# Patient Record
Sex: Female | Born: 1940 | Race: White | Hispanic: No | Marital: Married | Smoking: Former smoker
Health system: Southern US, Community
[De-identification: ages and names within clinical notes are randomized; demographics above are authoritative.]

## PROBLEM LIST (undated history)

## (undated) DIAGNOSIS — J449 Chronic obstructive pulmonary disease, unspecified: Secondary | ICD-10-CM

## (undated) DIAGNOSIS — Z9889 Other specified postprocedural states: Secondary | ICD-10-CM

## (undated) DIAGNOSIS — I214 Non-ST elevation (NSTEMI) myocardial infarction: Secondary | ICD-10-CM

## (undated) DIAGNOSIS — M797 Fibromyalgia: Secondary | ICD-10-CM

## (undated) DIAGNOSIS — I10 Essential (primary) hypertension: Secondary | ICD-10-CM

## (undated) DIAGNOSIS — E785 Hyperlipidemia, unspecified: Secondary | ICD-10-CM

## (undated) DIAGNOSIS — N289 Disorder of kidney and ureter, unspecified: Secondary | ICD-10-CM

## (undated) HISTORY — DX: Essential (primary) hypertension: I10

## (undated) HISTORY — PX: TOTAL KNEE ARTHROPLASTY: SHX125

## (undated) HISTORY — PX: CARPAL TUNNEL RELEASE: SHX101

## (undated) HISTORY — DX: Fibromyalgia: M79.7

## (undated) HISTORY — DX: Hyperlipidemia, unspecified: E78.5

## (undated) HISTORY — DX: Non-ST elevation (NSTEMI) myocardial infarction: I21.4

## (undated) HISTORY — DX: Disorder of kidney and ureter, unspecified: N28.9

## (undated) HISTORY — DX: Chronic obstructive pulmonary disease, unspecified: J44.9

## (undated) HISTORY — PX: CHOLECYSTECTOMY: SHX55

## (undated) HISTORY — DX: Other specified postprocedural states: Z98.890

---

## 1997-06-21 ENCOUNTER — Inpatient Hospital Stay (HOSPITAL_COMMUNITY): Admission: EM | Admit: 1997-06-21 | Discharge: 1997-06-23 | Payer: Self-pay | Admitting: Cardiology

## 1997-06-22 ENCOUNTER — Encounter (INDEPENDENT_AMBULATORY_CARE_PROVIDER_SITE_OTHER): Payer: Self-pay | Admitting: *Deleted

## 2005-07-12 ENCOUNTER — Encounter: Payer: Self-pay | Admitting: Cardiology

## 2005-07-18 ENCOUNTER — Ambulatory Visit: Payer: Self-pay | Admitting: Cardiology

## 2006-10-03 ENCOUNTER — Ambulatory Visit: Payer: Self-pay | Admitting: Cardiology

## 2007-07-16 ENCOUNTER — Inpatient Hospital Stay (HOSPITAL_COMMUNITY): Admission: RE | Admit: 2007-07-16 | Discharge: 2007-07-20 | Payer: Self-pay | Admitting: Orthopedic Surgery

## 2008-02-26 DIAGNOSIS — I214 Non-ST elevation (NSTEMI) myocardial infarction: Secondary | ICD-10-CM

## 2008-02-26 HISTORY — DX: Non-ST elevation (NSTEMI) myocardial infarction: I21.4

## 2008-12-22 ENCOUNTER — Ambulatory Visit: Payer: Self-pay | Admitting: Cardiology

## 2008-12-23 ENCOUNTER — Encounter: Payer: Self-pay | Admitting: Cardiology

## 2008-12-25 ENCOUNTER — Encounter: Payer: Self-pay | Admitting: Cardiology

## 2008-12-27 ENCOUNTER — Encounter: Payer: Self-pay | Admitting: Cardiology

## 2008-12-28 ENCOUNTER — Encounter: Payer: Self-pay | Admitting: Cardiology

## 2009-02-02 ENCOUNTER — Ambulatory Visit: Payer: Self-pay | Admitting: Cardiology

## 2009-02-02 DIAGNOSIS — I10 Essential (primary) hypertension: Secondary | ICD-10-CM | POA: Insufficient documentation

## 2009-02-02 DIAGNOSIS — E785 Hyperlipidemia, unspecified: Secondary | ICD-10-CM | POA: Insufficient documentation

## 2009-02-02 DIAGNOSIS — F17200 Nicotine dependence, unspecified, uncomplicated: Secondary | ICD-10-CM | POA: Insufficient documentation

## 2009-02-02 DIAGNOSIS — J449 Chronic obstructive pulmonary disease, unspecified: Secondary | ICD-10-CM | POA: Insufficient documentation

## 2009-02-02 DIAGNOSIS — I959 Hypotension, unspecified: Secondary | ICD-10-CM

## 2009-02-02 DIAGNOSIS — E669 Obesity, unspecified: Secondary | ICD-10-CM

## 2009-02-02 DIAGNOSIS — N259 Disorder resulting from impaired renal tubular function, unspecified: Secondary | ICD-10-CM | POA: Insufficient documentation

## 2009-02-02 DIAGNOSIS — F411 Generalized anxiety disorder: Secondary | ICD-10-CM | POA: Insufficient documentation

## 2009-02-02 DIAGNOSIS — J4489 Other specified chronic obstructive pulmonary disease: Secondary | ICD-10-CM | POA: Insufficient documentation

## 2009-02-02 DIAGNOSIS — R0602 Shortness of breath: Secondary | ICD-10-CM | POA: Insufficient documentation

## 2009-02-02 DIAGNOSIS — R071 Chest pain on breathing: Secondary | ICD-10-CM

## 2009-02-03 ENCOUNTER — Encounter: Payer: Self-pay | Admitting: Physician Assistant

## 2009-02-22 ENCOUNTER — Encounter: Payer: Self-pay | Admitting: Physician Assistant

## 2009-03-01 ENCOUNTER — Encounter (INDEPENDENT_AMBULATORY_CARE_PROVIDER_SITE_OTHER): Payer: Self-pay | Admitting: *Deleted

## 2009-03-10 ENCOUNTER — Ambulatory Visit: Payer: Self-pay | Admitting: Cardiology

## 2009-03-10 DIAGNOSIS — I214 Non-ST elevation (NSTEMI) myocardial infarction: Secondary | ICD-10-CM | POA: Insufficient documentation

## 2009-03-28 ENCOUNTER — Encounter: Payer: Self-pay | Admitting: Physician Assistant

## 2010-03-27 NOTE — Assessment & Plan Note (Signed)
Summary: 1 mo fu -srs   Visit Type:  Follow-up Primary Provider:  Dr. Kirstie Peri   History of Present Illness: 70 year old woman presents for a followup visit, last seen in the office back in December. she continues to do well, with no progressive shortness of breath, and no significant chest pain. She states that she has stopped smoking completely at this point. I congratulated her. She has tolerated the medications outlined below.  Labs from 10 December revealed a BUN and creatinine of 31 and 1.5 respectively, sodium 136, potassium 5.0. She was continued on ACE inhibitor therapy with followup labs on 29 December revealing a stable BUN and creatinine of 25 and 1.5 respectively, sodium 143, and potassium 4.9.  She is due to see Dr. Sherryll Burger for a physical in the near future, with lab work at that time.  Preventive Screening-Counseling & Management  Alcohol-Tobacco     Smoking Status: quit     Year Started: 57 years - started age 32     Year Quit: 12-24-2008  Current Medications (verified): 1)  Vitamin D 1000 Unit Tabs (Cholecalciferol) .... Take 1 Tablet By Mouth Once A Day 2)  Lasix 40 Mg Tabs (Furosemide) .... Take 1 Tablet By Mouth Once A Day 3)  Zestril 20 Mg Tabs (Lisinopril) .... Take 1 Tablet By Mouth Once A Day 4)  Aspirin 81 Mg Tbec (Aspirin) .... Take One Tablet By Mouth Daily 5)  Lyrica 150 Mg Caps (Pregabalin) .... Take 1 Capsule By Mouth Two Times A Day 6)  Potassium Chloride Crys Cr 20 Meq Cr-Tabs (Potassium Chloride Crys Cr) .... Take One Tablet By Mouth Daily 7)  Meloxicam 7.5 Mg Tabs (Meloxicam) .... Take 1 Tablet By Mouth Two Times A Day 8)  Omeprazole 20 Mg Cpdr (Omeprazole) .... Take 1 Tablet By Mouth Once A Day 9)  Valium 10 Mg Tabs (Diazepam) .... As Needed 10)  Lortab 10 10-500 Mg Tabs (Hydrocodone-Acetaminophen) .... As Needed 11)  Citalopram Hydrobromide 20 Mg Tabs (Citalopram Hydrobromide) .... Take 1 Tablet By Mouth Once A Day 12)  Amitriptyline Hcl 10 Mg Tabs  (Amitriptyline Hcl) .... Take 1 Tab By Mouth At Bedtime  Allergies (verified): No Known Drug Allergies  Past History:  Past Medical History: Last updated: 02/02/2009 Non-ST elevation myocardial infarction, probable type 2, 11/10   a) normal coronary arteries in 1999 [facility]) Hypertension COPD Morbid obesity Fibromyalgia with chronic pain Hyperlipidemia Renal insufficiency  Social History: Last updated: 02/02/2009 Retired  (worked at [employer]) Married  Tobacco Use - Former.  Alcohol Use - no  Social History: Smoking Status:  quit  Review of Systems  The patient denies anorexia, fever, chest pain, syncope, peripheral edema, prolonged cough, headaches, hemoptysis, melena, and hematochezia.         Otherwise reviewed and negative.  Vital Signs:  Patient profile:   70 year old female Height:      65 inches Weight:      269.75 pounds Pulse rate:   60 / minute BP sitting:   112 / 62  (left arm) Cuff size:   large  Vitals Entered By: Hoover Brunette, LPN (March 10, 2009 1:45 PM) Is Patient Diabetic? No Comments 1 month f/u   Physical Exam  Additional Exam:  Obese woman in no acute distress. HEENT: Conjunctiva and lids normal, oropharynx clear. Neck: Supple, increased girth, no bruits or thyromegaly. Lungs: Clear with diminished breath sounds nonlabored. Cardiac: Indistinct PMI, regular rate and rhythm, no S3. Abdomen: Obese, nontender, bowel sounds  present. Skin: Warm and dry. Musculoskeletal: No kyphosis. Neuropsychiatric: Alert and oriented x3, affect appropriate.   Impression & Recommendations:  Problem # 1:  ACUT MI SUBENDOCARDIAL INFARCT SUBSQT EPIS CARE (ICD-410.72)  History of probable type II non-ST elevation myocardial infarction as outlined previously. She is not experiencing any significant chest pain or breathlessness at this point on fairly minimal medical therapy. Resting heart rate is well controlled, so we will hold off on adding beta blocker  therapy. Blood pressure is also very well controlled. She will follow up with Dr. Sherryll Burger for a physical in the near future, and we will see her back in 6 months.  Her updated medication list for this problem includes:    Zestril 20 Mg Tabs (Lisinopril) .Marland Kitchen... Take 1 tablet by mouth once a day    Aspirin 81 Mg Tbec (Aspirin) .Marland Kitchen... Take one tablet by mouth daily  Problem # 2:  RENAL INSUFFICIENCY (ICD-588.9)  Stable based on serial labs as detailed above, on Zestril.  Problem # 3:  HYPERTENSION (ICD-401.9)  Blood pressure well controlled today.  Her updated medication list for this problem includes:    Lasix 40 Mg Tabs (Furosemide) .Marland Kitchen... Take 1 tablet by mouth once a day    Zestril 20 Mg Tabs (Lisinopril) .Marland Kitchen... Take 1 tablet by mouth once a day    Aspirin 81 Mg Tbec (Aspirin) .Marland Kitchen... Take one tablet by mouth daily  Problem # 4:  HYPERLIPIDEMIA (ICD-272.4)  History of hyperlipidemia. Patient should have a followup lipid profile with liver function tests at her physical with Dr. Sherryll Burger. Additional medical therapy may be required.  The following medications were removed from the medication list:    Simvastatin 40 Mg Tabs (Simvastatin) .Marland Kitchen... Take one tablet by mouth daily at bedtime  Patient Instructions: 1)  Your physician wants you to follow-up in: 6 months. You will receive a reminder letter in the mail one-two months in advance. If you don't receive a letter, please call our office to schedule the follow-up appointment. 2)  Your physician recommends that you continue on your current medications as directed. Please refer to the Current Medication list given to you today.

## 2010-03-27 NOTE — Letter (Signed)
Summary: Generic Engineer, agricultural at [facility] [address]   [place], [place] [address]   Phone: [phone]  Fax: [phone]        March 01, 2009 MRN: [id number]    Sara Riggs [address] [place], [place]  [address]    Dear [name],  Enclosed you will find the order for your lab work as we discussed on the phone today. Please, take this order with you to the [facility] around February 1st to have this lab work done. You do not need to be fasting.        Sincerely,  Cyril Loosen, RN, BSN  This letter has been electronically signed by your physician.

## 2010-07-10 NOTE — Consult Note (Signed)
Sara Riggs, Sara Riggs NO.:  [id number]   MEDICAL RECORD NO.:  [id number]          PATIENT TYPE:  INP   LOCATION:  1611                         FACILITY:  [facility]   PHYSICIAN:  Michaelyn Barter, M.D. DATE OF BIRTH:  March 02, 1940   DATE OF CONSULTATION:  DATE OF DISCHARGE:                                 CONSULTATION   HISTORY OF PRESENT ILLNESS:  Ms. Sara Riggs is a 70 year old female with a  past medical history of hypertension and COPD, who was actually admitted  by the orthopedic service.  On Jul 16, 2007, she underwent a left LCS  total knee replacement secondary to having osteoarthritis with severe  osteoporosis.  Currently, she is wide awake and indicates that she feels  okay.  She denies having any shortness of breath or chest pain.  She  currently has no complaints.  She denies any nausea, vomiting, fevers or  chills.   PAST MEDICAL HISTORY:  1. Hypertension.  2. Questionable history of diabetes mellitus.  The patient states that      she was told by her primary care doctor that she had diabetes      mellitus; however, she has not taken any medications.  She      questions whether or not she actually has diabetes mellitus.  3. Fibromyalgia.  4. COPD.  Patient indicates that she does have home oxygen, but she      uses it only occasionally.  5. Bronchitis.  6. Depression.   PAST SURGICAL HISTORY:  1. Total hysterectomy secondary to fibroids.  2. Cholecystectomy.  3. C-section.   ALLERGIES:  No known drug allergies.   HOME MEDICATIONS:  1. Ventolin inhaler.  2. Methylprednisolone, started Jul 09, 2007.  3. Azithromycin.  4. Lisinopril.  5. Furosemide.  6. Omeprazole.  7. Potassium chloride.  8. Citalopram.  9. Metolazone.  10.Diazepam.  11.Lyrica.  12.Hydrocodone APAP.   ALLERGIES:  No known drug allergies.   SOCIAL HISTORY:  Cigarettes:  Patient started smoking at the age of 70.  She smokes a half pack of cigarettes per day.  Alcohol:  Patient  denies.   FAMILY HISTORY:  Mother had asthma.  She died from an MI at the age of  16.  Father had peptic ulcer disease.   REVIEW OF SYSTEMS:  As per HPI.   PHYSICAL EXAMINATION:  Patient is awake.  She is cooperative.  She is in  no obvious distress.  She currently looks comfortable.  VITALS:  Temperature is 98.7.  Heart rate 69.  Respirations 20.  Blood  pressure 100/53.  O2 sat 88% on room air.  NECK:  Supple.  No JVD.  No lymphadenopathy.  CARDIAC:  S1 and S2 present.  Regular rate and rhythm.  RESPIRATORY:  Breath sounds are decreased bilaterally.  There are  scattered expiratory wheezes also auscultated.  ABDOMEN:  Soft, nontender, nondistended.  Bowel sounds are present.  No  masses palpated.  EXTREMITIES:  Right leg has no edema.  NEUROLOGIC:  Patient is alert and oriented x3.  MUSCULOSKELETAL:  Upper extremity strength 5/5.   LABS:  Sodium  140, potassium 5.2, chloride 103, CO2 31, glucose 153, BUN  48, creatinine 1.72, calcium 9.  White blood cell count 12.4, hemoglobin  13, hematocrit 39, platelet count 118.  Urinalysis is negative.   ASSESSMENT/PLAN:  1. Hyperkalemia:  The patient is currently receiving daily potassium      supplementation.  The patient's hyperkalemia may be secondary to      this.  Will discontinue the K-Dur.  Will consider giving the      patient Kayexalate.  Will repeat her potassium level in the      morning.  2. Renal insufficiency:  Whether or not this is acute-on-chronic is      also questionable.  Given the fact that the patient recently      underwent a surgical procedure, there may be an acute component      secondary to fluid loss, which would account for a prerenal      component; however, again, the patient's baseline BUN and      creatinine are unknown.  Likewise, the patient does have a history      of hypertension and a questionable history of diabetes mellitus;      therefore, both of those factors may have also contributed to a       chronic component.  The patient does have a history of      hypertension; however, it appears that she has been hypotensive to      normotensive over the past 24 hours; increasing the likelihood that      this may an acute process.  Will continue the current IV fluid      hydration.  Will order a renal ultrasound.  Will monitor her BUN      and creatinine closely.  Will consider holding any medications      which may precipitate an adverse effect on the patient's renal      system, i.e., her lisinopril.  There may not be a relationship      between her elevated BUN and creatinine and the lisinopril, given      the fact that she has more than likely has been on this for quite      some time.  3. History of hypertension:  The patient's blood pressure is      normotensive to hypotensive.  There may be an element of volume      depletion accounting for this.  The patient is on multiple      antihypertensive medications, including a diuretic, i.e., Lasix.      She currently is receiving IV fluid hydration; however, Lasix may      negate the benefit of her receiving the IV fluids.  May consider      holding the Lasix and re-evaluating the patient's blood pressure      and renal status within the next 24 hours.  4. Thrombocytopenia:  Will monitor this for now.  Will continue to      follow the patient.   Thanks for the consult.     Michaelyn Barter, M.D.  Electronically Signed    OR/MEDQ  D:  07/17/2007  T:  07/17/2007  Job:  [id number]

## 2010-07-10 NOTE — Op Note (Signed)
Sara Riggs, Sara Riggs                ACCOUNT NO.:  [id number]   MEDICAL RECORD NO.:  [id number]          PATIENT TYPE:  INP   LOCATION:  0001                         FACILITY:  [facility]   PHYSICIAN:  John L. Rendall, M.D.  DATE OF BIRTH:  Jul 17, 1940   DATE OF PROCEDURE:  07/16/2007  DATE OF DISCHARGE:                               OPERATIVE REPORT   PREOPERATIVE DIAGNOSIS:  End-stage osteoarthritis left knee.   SURGICAL PROCEDURES:  Left LCS total knee replacement with computer  navigation assistance.   POSTOPERATIVE DIAGNOSIS:  Osteoarthritis with severe osteoporosis.   SURGEON:  Rendall   ASSISTANT:  Duffy PAC   ANESTHESIA:  Epidural.   PATHOLOGY:  The patient has worn out knee with chronic pain unremitting  despite conservative measures of pills and injection.  She has bone-  against-bone on the medial femoral condyle, slight varus deformity and  slight hyperextension to the knee.  She has poor bone quality, and it is  possible to dent the cut surface of the proximal tibia with an index  finger with less than 10 pounds pressure.   PROCEDURE:  Under epidural anesthesia, the left leg is prepared with  DuraPrep and draped as a sterile field.  A high-on-the-thigh tourniquet  is applied sterilely, and the leg is wrapped out with the Esmarch, and  the tourniquet is used at 350 mm.  A midline incision is made.  The  patella is everted.  The femur is sized at a standard plus.  Debridement  is done in preparation for computer mapping.  The Schanz pins are then  placed in superomedial punctures on the tibia and in the distal medial  femur through the surgical incision.  The arrays are set up.  The  femoral head is identified.  The medial and lateral malleoli are  identified.  At this point, the proximal tibia and distal femur are  mapped to within 1 mm of reproducible accuracy.  Once this is completed,  the proximal tibial resection is carried out.  At this point, the very  soft bone is  clearly identified.  In view of her age and weight, it is  decided that we will go with a revision stem for an additional 1 inch of  purchase in the proximal tibia.  Once the proximal tibia cut is made,  the balancer is sought and is not found in our trays.  At this point, we  took a 25 minute time-out while a new balancer was obtained.  During  this time, we osteotomized the patella, preparing it for implantation  and did a synovectomy.  Once the balancer arrived, the alignment of the  leg was balanced within 1 degree of anatomical alignment from the hip to  the ankle, and the flexion gap was measured.  It was determined we would  probably need a 15 mm bearing and at this point, the anterior and  posterior flare of the distal femur were resected within 1 degree of  rotational anatomic accuracy.  The flexion gap was figured at about 15  mm at this point.  The distal femoral  cut was then made, and the  extension gap was at least 17-1/2 mm at this point.  The knee was then  debrided of the menisci and what is left of the cruciates and spurs  taken off the back of the femoral condyles.  The recessing guide was  then used.  Attention was then turned to the proximal tibia where  preparation for the MBT tray size 4 was made.  Trial of a size 4 MBT  tray, 15 and 17.5 bearing with the standard plus femur revealed the knee  went into too much hyperextension, although longitudinal alignment was  within about 2.5-3 degrees of anatomic alignment.  The 20 mm spacer was  inserted, and that still had 9 degrees of hyperextension.  A decision  was then made to add metal blocks under the tibia to build it up and  downsize the tibial bearing to a 17.5, essentially filling in a 22.5 mm  gap.  This was done, and alignment was within 2.5 degrees of anatomic  with full extension and no significant hyperextension.  It was good in  both flexion and extension, and permanent components were then obtained.  Bony  surfaces were prepared with pulse irrigation.  Cement was mixed and  applied and all components cemented in place.  Tourniquet was let down  at 1 hour and 45 minutes.  Multiple small vessels were cauterized.  Medium Hemovac was used.  The knee was then closed in layers with #2  quill, #1 quill, 0 quill, 2-0 Vicryl, and skin clips.  Operative time  approximately 2 hours.  The patient tolerated the procedure well and  returned to recovery in good condition.      John L. Rendall, M.D.  Electronically Signed     JLR/MEDQ  D:  07/16/2007  T:  07/16/2007  Job:  [id number]

## 2010-07-13 NOTE — Discharge Summary (Signed)
Sara Riggs, Sara Riggs                ACCOUNT NO.:  [id number]   MEDICAL RECORD NO.:  [id number]          PATIENT TYPE:  INP   LOCATION:  1534                         FACILITY:  [facility]   PHYSICIAN:  John L. Rendall, M.D.  DATE OF BIRTH:  1941/01/05   DATE OF ADMISSION:  07/16/2007  DATE OF DISCHARGE:  07/20/2007                               DISCHARGE SUMMARY   ADMISSION DIAGNOSES:  1. End-stage osteoarthritis left knee.  2. Fibromyalgia.  3. Hypertension.  4. Chronic obstructive pulmonary disease (COPD)/bronchitis.  5. History of coronary artery disease.  6. Depression.  7. Gastroesophageal reflux disease.   DISCHARGE DIAGNOSES:  1. End-stage osteoarthritis left knee, status post left total knee      arthroplasty.  2. Acute blood loss anemia secondary to surgery.  3. Hyperkalemia.  4. Postop hypotension, now resolved.  5. Renal insufficiency.  6. Thrombocytopenia.  7. Hypertension.  8. Fibromyalgia.  9. Chronic obstructive pulmonary disease (COPD)/bronchitis.  10.History of coronary artery disease.  11.Depression.  12.Gastroesophageal reflux disease.   SURGICAL PROCEDURES:  On Jul 16, 2007, Ms. Truluck underwent a left total  knee arthroplasty with computer navigation by Dr. Jonny Ruiz L. Rendall  assisted by Arnoldo Morale PA-C.  She had a DePuy NBT revision tibial tray  size four placed with an LCS complete primary femoral component cemented  size standard plus left.  The LCS complete metal back patella cemented  size standard plus and LCS complete RP insert size standard plus 17.5-mm  thickness and two NBT step wedges size four 5 mm thickness.   COMPLICATIONS:  None.   CONSULTANT:  1. Internal Medicine consult was obtained on Jul 16, 2007 and they      followed her throughout her hospitalization.  That was done by Dr.      Roxan Hockey.  2. Physical Therapy consult Jul 17, 2007 in addition to a Case      Management consult.  3. Occupational Therapy consult Jul 18, 2007.   HISTORY OF PRESENT ILLNESS:  This 70 year old white female patient  presented to Dr. Priscille Kluver with a 62-month history of sudden onset but  progressively worsening left knee pain.  She does have history of a knee  scope in the past, but the pain is now a constant ache to sharp  sensation over the anterior medial joint line with radiation into the  thigh.  It increases with standing, decreases with sitting and  reclining.  The knee grinds, rocks, swells and occasionally gives way.  She has failed conservative treatment and x-rays show end-stage  arthritic changes.  Because of this, she is presenting for a left knee  replacement.   HOSPITAL COURSE:  Ms. Egnew tolerated her surgical procedure well  without immediate postoperative complications.  She was transferred to  the Orthopedic Floor.  On postop day #1, she was having some difficulty  with hyperkalemia, also some hypotension, and a Medical consult was  obtained.  They followed her throughout the hospitalization.  Platelets  went down to 118.  Leg was neurovascularly intact.  She was started on  therapy per protocol.   Postop  day #2, hemoglobin 11.4.  Dressing was changed.  She was  continued on therapy.  Her high potassium was coming down.  She did have  some renal insufficiency, which medicines was treating.  She was  continued on therapy.   She continued to make good orthopedic progress the next several days.  She was tolerating therapy well.  Her renal function did improve.  Hyperkalemia resolved, and on May 25 was ready for discharge home.   DISCHARGE INSTRUCTIONS:  Diet:  She is to resume her regular  prehospitalization diet.   MEDICATIONS:  She may resume his home meds as follows:  1. Ventolin inhaler 2 puffs inhaled q.i.d.  2. She is to hold her prednisone dose pack and Z-Pak which should have      been completed preop.  3. Lisinopril 40 mg p.o. q.a.m.  4. Lasix 40 mg p.o. q.a.m.  5. Omeprazole 20 mg p.o. q.a.m.  6.  K-Dur 20 mEq p.o. q.a.m.  7. Citalopram 20 mg p.o. q.a.m.  8. Metolazone 2.5 mg p.o. q.a.m.  9. Valium 5 mg p.o. t.i.d. p.r.n.  10.Lyrica 150 mg p.o. b.i.d.  11.Vicodin is on hold at this time while she is on other meds.  12.Aspirin 81 mg p.o. q.a.m., on hold until her aspirin is resumed.   ADDITIONAL MEDICATIONS:  1. Arixtra 2.5 mg subcu every 8 p.m. with the last dose on May 27.      She may resume her aspirin on May 28.  2. Percocet 5/325, one to two tablets p.o. q.4 h p.r.n. for pain.   ACTIVITY:  She is to increase her activity slowly, weightbearing as  tolerated on the left leg with use of walker.  She is to have home CPM  zero to 90 degrees 6-8 hours a day.  No lifting or driving for 6 weeks.  Please see the blue total knee discharge sheet for further activity  instructions.   WOUND CARE:  She may shower after no drainage from the wound for 2 days.  She is to see the blue total knee discharge sheet for further wound care  instructions.   FOLLOW-UP:  She is to follow up with Dr. Priscille Kluver in our office on Monday  June 1 in our [place] office and she is to call [phone] for that  appointment.   LABORATORY DATA:  Hemoglobin/hematocrit ranged from 15.9 and 46.7 on the  15th to 10.9 and 31.20 on the 25th.  White count went from 11.8 on the  15th to 12.4 on the 22nd to 9 on the 25th.  Platelets went from 153 on  the 15th to 105 on the 23rd to 128 on the 25th.   Glucose ranged from 140 on the 15th to 153 on the 22nd to 123 on the  25th.  Potassium went to high of 5.2 on the 22nd and then was back  within normal limits.  BUN and creatinine were 39 and 1.66 on the 15th,  went to a high of 48 and 1.72 on the 22nd and then on the 25th was 26  and 1.21.   Estimated GFR was 31 on the 15th.  It went to a low of 29 on the 22nd  and then went to 34 on the 25th.   Triglycerides on the 23rd were 84, HDL was 39, ratio was 3.7, VLDL was  17, LDL was 88.  Hemoglobin A1c was 6%.   Urine  culture done May 15 grew out 35,000 colonies per mL of E-coli  sensitive to all antibiotics.  All other laboratory studies were within  normal limits.      Legrand Pitts Duffy, P.A.      John L. Rendall, M.D.  Electronically Signed    KED/MEDQ  D:  09/09/2007  T:  09/09/2007  Job:  [id number]

## 2010-11-21 LAB — CBC
HCT: 46.7 — ABNORMAL HIGH
Hemoglobin: 11.4 — ABNORMAL LOW
Hemoglobin: 11.8 — ABNORMAL LOW
Hemoglobin: 15.9 — ABNORMAL HIGH
MCHC: 33.5
MCHC: 34
MCHC: 34
MCHC: 34.4
MCHC: 35
MCV: 104.1 — ABNORMAL HIGH
MCV: 104.2 — ABNORMAL HIGH
Platelets: 128 — ABNORMAL LOW
RBC: 3.21 — ABNORMAL LOW
RBC: 3.28 — ABNORMAL LOW
RBC: 3.67 — ABNORMAL LOW
RBC: 4.48
RDW: 13.8
WBC: 10.3
WBC: 12.4 — ABNORMAL HIGH

## 2010-11-21 LAB — PROTIME-INR
INR: 0.8
Prothrombin Time: 11.6

## 2010-11-21 LAB — CROSSMATCH: Antibody Screen: NEGATIVE

## 2010-11-21 LAB — COMPREHENSIVE METABOLIC PANEL
ALT: 25
BUN: 39 — ABNORMAL HIGH
CO2: 29
Calcium: 10.1
Creatinine, Ser: 1.66 — ABNORMAL HIGH
GFR calc non Af Amer: 31 — ABNORMAL LOW
Glucose, Bld: 140 — ABNORMAL HIGH
Sodium: 138

## 2010-11-21 LAB — DIFFERENTIAL
Eosinophils Absolute: 0
Lymphs Abs: 1.5
Neutro Abs: 9.9 — ABNORMAL HIGH
Neutrophils Relative %: 83 — ABNORMAL HIGH

## 2010-11-21 LAB — URINE CULTURE
Colony Count: NO GROWTH
Special Requests: NEGATIVE
Special Requests: POSITIVE

## 2010-11-21 LAB — BASIC METABOLIC PANEL
BUN: 20
BUN: 26 — ABNORMAL HIGH
CO2: 31
CO2: 31
CO2: 32
Calcium: 8.5
Calcium: 8.8
Calcium: 9
Calcium: 9
Chloride: 103
Creatinine, Ser: 1.21 — ABNORMAL HIGH
Creatinine, Ser: 1.24 — ABNORMAL HIGH
Creatinine, Ser: 1.72 — ABNORMAL HIGH
GFR calc Af Amer: 36 — ABNORMAL LOW
GFR calc Af Amer: 51 — ABNORMAL LOW
GFR calc Af Amer: 52 — ABNORMAL LOW
Glucose, Bld: 115 — ABNORMAL HIGH
Glucose, Bld: 123 — ABNORMAL HIGH
Sodium: 139

## 2010-11-21 LAB — LIPID PANEL
HDL: 39 — ABNORMAL LOW
LDL Cholesterol: 88
Triglycerides: 84

## 2010-11-21 LAB — URINALYSIS, ROUTINE W REFLEX MICROSCOPIC
Bilirubin Urine: NEGATIVE
Hgb urine dipstick: NEGATIVE
Nitrite: NEGATIVE
Protein, ur: NEGATIVE
Specific Gravity, Urine: 1.009
Specific Gravity, Urine: 1.014
Urobilinogen, UA: 0.2
pH: 5.5

## 2010-11-21 LAB — HEMOGLOBIN A1C: Mean Plasma Glucose: 136

## 2011-06-26 ENCOUNTER — Other Ambulatory Visit (HOSPITAL_COMMUNITY): Payer: Self-pay | Admitting: Internal Medicine

## 2011-06-26 DIAGNOSIS — R911 Solitary pulmonary nodule: Secondary | ICD-10-CM

## 2011-07-03 ENCOUNTER — Other Ambulatory Visit (HOSPITAL_COMMUNITY): Payer: Self-pay

## 2011-07-10 ENCOUNTER — Other Ambulatory Visit (HOSPITAL_COMMUNITY): Payer: Self-pay

## 2011-07-19 ENCOUNTER — Encounter (HOSPITAL_COMMUNITY)
Admission: RE | Admit: 2011-07-19 | Discharge: 2011-07-19 | Disposition: A | Discharge status: Home / Self Care | Payer: Medicare HMO | Source: Ambulatory Visit | Attending: Internal Medicine | Admitting: Internal Medicine

## 2011-07-19 DIAGNOSIS — R222 Localized swelling, mass and lump, trunk: Secondary | ICD-10-CM | POA: Insufficient documentation

## 2011-07-19 DIAGNOSIS — J984 Other disorders of lung: Secondary | ICD-10-CM | POA: Insufficient documentation

## 2011-07-19 DIAGNOSIS — R911 Solitary pulmonary nodule: Secondary | ICD-10-CM

## 2011-07-19 LAB — GLUCOSE, CAPILLARY: Glucose-Capillary: 105 mg/dL — ABNORMAL HIGH (ref 70–99)

## 2011-07-19 MED ORDER — FLUDEOXYGLUCOSE F - 18 (FDG) INJECTION: 17.0000 | Freq: Once | INTRAVENOUS | Status: AC | PRN

## 2011-09-12 ENCOUNTER — Encounter: Payer: Self-pay | Admitting: Cardiology

## 2012-08-14 ENCOUNTER — Other Ambulatory Visit (HOSPITAL_COMMUNITY): Payer: Self-pay | Admitting: Orthopedic Surgery

## 2012-08-14 DIAGNOSIS — M25562 Pain in left knee: Secondary | ICD-10-CM

## 2012-08-24 ENCOUNTER — Encounter (HOSPITAL_COMMUNITY)
Admission: RE | Admit: 2012-08-24 | Discharge: 2012-08-24 | Disposition: A | Discharge status: Home / Self Care | Payer: Medicare Other | Source: Ambulatory Visit | Attending: Orthopedic Surgery | Admitting: Orthopedic Surgery

## 2012-08-24 DIAGNOSIS — M25569 Pain in unspecified knee: Secondary | ICD-10-CM | POA: Insufficient documentation

## 2012-08-24 DIAGNOSIS — M25562 Pain in left knee: Secondary | ICD-10-CM

## 2012-08-24 MED ORDER — TECHNETIUM TC 99M MEDRONATE IV KIT
25.0000 | PACK | Freq: Once | INTRAVENOUS | Status: AC | PRN
  Administered 2012-08-24: 25 via INTRAVENOUS

## 2013-08-12 ENCOUNTER — Encounter (INDEPENDENT_AMBULATORY_CARE_PROVIDER_SITE_OTHER): Payer: Self-pay | Admitting: *Deleted

## 2013-09-20 ENCOUNTER — Ambulatory Visit (INDEPENDENT_AMBULATORY_CARE_PROVIDER_SITE_OTHER): Payer: Medicare Other | Admitting: Internal Medicine

## 2014-06-16 ENCOUNTER — Encounter (HOSPITAL_COMMUNITY)
Admission: RE | Admit: 2014-06-16 | Discharge: 2014-06-16 | Disposition: A | Discharge status: Home / Self Care | Payer: Medicare Other | Source: Ambulatory Visit | Attending: Internal Medicine | Admitting: Internal Medicine

## 2014-06-16 VITALS — BP 128/90 | HR 50 | Ht 65.0 in | Wt 204.6 lb

## 2014-06-16 DIAGNOSIS — J441 Chronic obstructive pulmonary disease with (acute) exacerbation: Secondary | ICD-10-CM

## 2014-06-16 DIAGNOSIS — J449 Chronic obstructive pulmonary disease, unspecified: Secondary | ICD-10-CM | POA: Diagnosis not present

## 2014-06-16 NOTE — Progress Notes (Signed)
Cardiac/Pulmonary Rehab Medication Review by a Pharmacist  Does the patient  feel that his/her medications are working for him/her?  yes  Has the patient been experiencing any side effects to the medications prescribed?  no  Does the patient measure his/her own blood pressure or blood glucose at home?  yes   Does the patient have any problems obtaining medications due to transportation or finances?   no  Understanding of regimen: good Understanding of indications: good Potential of compliance: good  Questions asked to Determine Patient Understanding of Medication Regimen:  1. What is the name of the medication?  2. What is the medication used for?  3. When should it be taken?  4. How much should be taken?  5. How will you take it?  6. What side effects should you report?  Understanding Defined as: Excellent: All questions above are correct Good: Questions 1-4 are correct Fair: Questions 1-2 are correct  Poor: 1 or none of the above questions are correct   Pharmacist comments: Understands meds, sometimes does not take Advair twice daily.  Sara Riggs, Sara Riggs 06/16/2014 2:11 PM

## 2014-06-16 NOTE — Progress Notes (Addendum)
Patient arrived at 1300. Patient was referred to Pulmonary Rehab by Dr. Shary Keyhar due tp COPD J44.9. During orientation advised patient on arrival and appointment times what to wear, what to do before, during and after exercise. Reviewed attendance and class policy. Talked about inclement weather and class consultation policy. Pt is scheduled to start Pulm. Rehab on 06/21/14 at 1:30 pm. Pt was advised to come to class 5 minutes before class starts. He was also given instructions on meeting with the dietician and attending the Family Structure classes. Pt is eager to get started. Patient was able to complete 6 minute walk test with the assistance of pushing a wheelchair. Patient was educated on pursed lip breathing. Handout given to demonstrate technique. Patient finished orientation/education at 1530.

## 2014-06-16 NOTE — Patient Instructions (Signed)
Pt has finished orientation and is scheduled to start PR on 06/21/14 at 1:30 pm. Pt has been instructed to arrive to class 15 minutes early for scheduled class. Pt has been instructed to wear comfortable clothing and shoes with rubber soles. Pt has been told to take their medications 1 hour prior to coming to class.  If the patient is not going to attend class, he/she has been instructed to call.

## 2014-06-20 NOTE — Progress Notes (Signed)
[facility] Pulmonary Rehabilitation Baseline Outcomes Assessment   Anthropometrics:  . Height (inches): 65 . Weight (kg): 92.7 . Grip strength was measured using a Dynamometer.  The patient's highest score was a 58.  Functional Status/Exercise Capacity: . Sara Riggs had a resting heart rate of 50 BPM, a resting blood pressure of 128/80, and an oxygen saturation of 91 % on 3 liters of O2.  Sara Riggs performed a 6-minute walk test on 06/16/2014.  The patient completed 700 feet in 6 minutes with 0 rest breaks.  This quantifies 2.02 METS.   Dyspnea Measures: . The [facility] is a simple and standardized method of classifying disability in patients with COPD.  The assessment correlates disability and dyspnea.  At entrance the patient scored a 2. The scale is provided below.   0= I only get breathless with strenuous exercise. 1= I get short of breath when hurrying on level ground or walking up a slight incline. 2= On level ground, I walk slower than people of the same age because of breathlessness, or have to stop for breath when walking at my own pace. 3= I stop for breath after walking 100 yards or after a few minutes on level ground. 4=I am too breathless to leave the house or I am breathless when dressing.   . The patient completed the [facility] Shortness Of Breath Questionnaire ([facility]).  This questionnaire relates activities of daily living and shortness of breath.  The score ranges from 0-120, a higher score relates to severe shortness of breath during activities of daily living. The patient's score at entrance was 50.  Quality of Life: . Ferrans and Powers Quality of Life Index Pulmonary Version is used to assess the patients satisfaction in different domains of their life; health and functioning, socioeconomic, psychological/spiritual, and family. The overall score is recorded out of 30 points.  The patient's goal is to achieve an overall score of 21 or higher.  Sara Riggs  received a 16.62 at entrance.  . The Patient Health Questionnaire (PHQ-2) is a first step approach for the screening of depression.  If the patient scores positive on the PHQ-2 the patient should be further assessed with the PHQ-9.  The Patient Health Questionnaire (PHQ-9) assesses the degree of depression.  Depression is important to monitor and track in pulmonary patients due to its prevalence in the population.  If the patient advances to the PHQ-9 the goal is to score less than 4 on this assessment.  Sara Riggs scored a 0 at entrance.  Clinical Assessment Tools: . The COPD Assessment Test (CAT) is a measurement tool to quantify how much of an impact the disease has on the patient's life.  This assessment aids the Pulmonary Rehab Team in designing the patients individualized treatment plan.  A CAT score ranges from 0-40.  A score of 10 or below indicates that COPD has a low impact on the patient's life whereas a score of 30 or higher indicates a severe impact. The patient's goal is a decrease of 1 point from entrance to discharge.  Sara Riggs had a CAT score of 12 at entrance.  Nutrition: . The "Rate My Plate" is a dietary assessment that quantifies the balance of a patient's diet.  This tool allows the Pulmonary Rehab Team to key in on the areas of the patient's diet that needs improving.  The team can then focus their nutritional education on those areas.  If the patient scores 24-40, this means there are many ways they  can make their eating habits healthier, 41-57 states that there are some ways they can make their eating habits healthier and a score of 58-72 states that they are making many healthy choices.  The patient's goal is to achieve a score of 49 or higher on this assessment.  Sara Riggs scored a 36 at entrance.  Oxygen Compliance: . Patient is currently on 3 liters at rest, 3 liters at night, and 3 liters for exercise.  Sara Riggs is not currently using a cpap/bipap at night.  The patient states that they do  not have barriers that keep them from using their oxygen.   Education: . Sara Riggs will attend education classes during the course of Pulmonary Rehab.  Education classes that will be offered to the patient are Activities of Daily Living and Energy Conservation, Pursed Lip Breathing and Diaphragmatic Breathing, Nutrition, Exercise for the Pulmonary Patient, Warning Signs of Infection, Chronic Lung Disease, Advanced Directives, Medications, and Stress and Meditation.  The patient completed an assessment at the entrance of the program and will complete it again upon discharge to demonstrate the level of understanding provided by the educational classes.  This assessment includes 14 questions regarding all of the education topics above.  Sara Riggs achieved a score of 6/14 at entrance.  Smoking Cessation:  N/A  Exercise:  Sara Riggs will be provided with an individualized Home Exercise Prescription (HEP) at the entrance of the program.  The patient will be followed by the Pulmonary Exercise Physiologist throughout the program to assist with the progression of the frequency, intensity, time, and type of exercise. The patient's long-term goal is to be exercising 30-60 minutes, 3-5 days per week. At entrance, the patient was exercising 0 days at home.

## 2014-06-21 ENCOUNTER — Encounter (HOSPITAL_COMMUNITY): Payer: Medicare Other

## 2014-06-23 ENCOUNTER — Encounter (HOSPITAL_COMMUNITY)
Admission: RE | Admit: 2014-06-23 | Discharge: 2014-06-23 | Disposition: A | Discharge status: Home / Self Care | Payer: Medicare Other | Source: Ambulatory Visit | Attending: Internal Medicine | Admitting: Internal Medicine

## 2014-06-23 DIAGNOSIS — J449 Chronic obstructive pulmonary disease, unspecified: Secondary | ICD-10-CM | POA: Diagnosis not present

## 2014-06-28 ENCOUNTER — Encounter (HOSPITAL_COMMUNITY)
Admission: RE | Admit: 2014-06-28 | Discharge: 2014-06-28 | Disposition: A | Discharge status: Home / Self Care | Payer: Medicare Other | Source: Ambulatory Visit | Attending: Internal Medicine | Admitting: Internal Medicine

## 2014-06-28 DIAGNOSIS — J449 Chronic obstructive pulmonary disease, unspecified: Secondary | ICD-10-CM | POA: Diagnosis not present

## 2014-06-28 NOTE — Progress Notes (Signed)
Sara Riggs 74 y.o. female  Initial Psychosocial Assessment  Pt psychosocial assessment reveals pt lives with their spouse. Pt is currently retired. Pt hobbies include watching birds and playing with grand and great grand children. Pt reports her stress level is low. Areas of stress/anxiety include none.  Pt does not exhibit signs of depression.  Pt shows good  coping skills with positive outlook .  Offered emotional support and reassurance. Monitor and evaluate progress toward psychosocial goal(s).  Goal(s): Help patient work toward returning to meaningful activities that improve patient's QOL and are attainable with patient's lung disease Get stronger for her surgery  06/28/2014 2:45 PM

## 2014-06-30 ENCOUNTER — Encounter (HOSPITAL_COMMUNITY): Payer: Medicare Other

## 2014-07-05 ENCOUNTER — Encounter (HOSPITAL_COMMUNITY): Payer: Medicare Other

## 2014-07-07 ENCOUNTER — Encounter (HOSPITAL_COMMUNITY): Payer: Medicare Other

## 2014-07-12 ENCOUNTER — Encounter (HOSPITAL_COMMUNITY): Payer: Medicare Other

## 2014-07-14 ENCOUNTER — Encounter (HOSPITAL_COMMUNITY)
Admission: RE | Admit: 2014-07-14 | Discharge: 2014-07-14 | Disposition: A | Discharge status: Home / Self Care | Payer: Medicare Other | Source: Ambulatory Visit | Attending: Internal Medicine | Admitting: Internal Medicine

## 2014-07-14 DIAGNOSIS — J449 Chronic obstructive pulmonary disease, unspecified: Secondary | ICD-10-CM | POA: Diagnosis not present

## 2014-07-18 NOTE — Progress Notes (Signed)
[facility] today, Jul 18, 2014  with 4 sessions. Patient quit program due to TKR surgery Jul 26, 2014

## 2014-07-19 ENCOUNTER — Encounter (HOSPITAL_COMMUNITY): Payer: Medicare Other

## 2014-07-21 ENCOUNTER — Encounter (HOSPITAL_COMMUNITY): Payer: Medicare Other

## 2014-07-26 ENCOUNTER — Encounter (HOSPITAL_COMMUNITY): Payer: Medicare Other

## 2014-07-28 ENCOUNTER — Encounter (HOSPITAL_COMMUNITY): Payer: Medicare Other

## 2014-08-10 NOTE — Addendum Note (Signed)
Encounter addended by: Julious Payer, CCT on: 08/10/2014  2:31 PM<BR>     Documentation filed: Notes Section

## 2014-08-10 NOTE — Progress Notes (Signed)
[facility] Pulmonary Rehabilitation                                                             Final/Discharge Outcome Results  Anthropometrics: . Height (inches): 65 . Weight (kg): 97.1 ? This is a change of 4.4kg from entrance.  Functional Status/Exercise Capacity: Azarria had a resting heart rate of 60 BPM, a resting blood pressure of 138/80, and an oxygen saturation of 96 % on 3 liters of O2.      Oxygen Compliance: . Patient is currently on 3 liters at rest, 3 liters at night, and 3 liters for exercise. The patient states that they do not have barriers that keep them from using their oxygen. ? This is a no change result from entrance.    Education: ? Neydelin attended 4/13 education classes.    Smoking Cessation:  N/A   Patient stopped attending program after completing 4 sessions. Further outcome results are not applicable due to patient stopping program at 4 sessions.

## 2015-05-11 ENCOUNTER — Other Ambulatory Visit (HOSPITAL_COMMUNITY): Payer: Self-pay | Admitting: Pulmonary Disease

## 2015-05-11 DIAGNOSIS — R918 Other nonspecific abnormal finding of lung field: Secondary | ICD-10-CM

## 2015-05-23 ENCOUNTER — Encounter (HOSPITAL_COMMUNITY): Payer: Medicare Other

## 2015-06-01 ENCOUNTER — Encounter (HOSPITAL_COMMUNITY)
Admission: RE | Admit: 2015-06-01 | Discharge: 2015-06-01 | Disposition: A | Discharge status: Home / Self Care | Payer: Medicare Other | Source: Ambulatory Visit | Attending: Pulmonary Disease | Admitting: Pulmonary Disease

## 2015-06-01 ENCOUNTER — Other Ambulatory Visit (HOSPITAL_COMMUNITY): Payer: Self-pay | Admitting: Pulmonary Disease

## 2015-06-01 DIAGNOSIS — R918 Other nonspecific abnormal finding of lung field: Secondary | ICD-10-CM

## 2015-06-01 LAB — GLUCOSE, CAPILLARY: GLUCOSE-CAPILLARY: 117 mg/dL — AB (ref 65–99)

## 2015-06-01 MED ORDER — FLUDEOXYGLUCOSE F - 18 (FDG) INJECTION
10.1000 | Freq: Once | INTRAVENOUS | Status: AC | PRN
  Administered 2015-06-01: 10.1 via INTRAVENOUS

## 2015-06-05 ENCOUNTER — Other Ambulatory Visit (HOSPITAL_COMMUNITY): Payer: Self-pay | Admitting: Pulmonary Disease

## 2015-06-05 DIAGNOSIS — R918 Other nonspecific abnormal finding of lung field: Secondary | ICD-10-CM

## 2015-06-12 ENCOUNTER — Other Ambulatory Visit: Payer: Self-pay | Admitting: General Surgery

## 2015-06-12 ENCOUNTER — Other Ambulatory Visit: Payer: Self-pay | Admitting: Radiology

## 2015-06-13 ENCOUNTER — Encounter (HOSPITAL_COMMUNITY): Payer: Self-pay

## 2015-06-13 ENCOUNTER — Ambulatory Visit (HOSPITAL_COMMUNITY)
Admission: RE | Admit: 2015-06-13 | Discharge: 2015-06-13 | Disposition: A | Discharge status: Home / Self Care | Payer: Medicare Other | Source: Ambulatory Visit | Attending: Pulmonary Disease | Admitting: Pulmonary Disease

## 2015-06-13 ENCOUNTER — Ambulatory Visit (HOSPITAL_COMMUNITY)
Admission: RE | Admit: 2015-06-13 | Discharge: 2015-06-13 | Disposition: A | Discharge status: Home / Self Care | Payer: Medicare Other | Source: Ambulatory Visit | Attending: Interventional Radiology | Admitting: Interventional Radiology

## 2015-06-13 DIAGNOSIS — Z87891 Personal history of nicotine dependence: Secondary | ICD-10-CM | POA: Diagnosis not present

## 2015-06-13 DIAGNOSIS — R911 Solitary pulmonary nodule: Secondary | ICD-10-CM | POA: Insufficient documentation

## 2015-06-13 DIAGNOSIS — M797 Fibromyalgia: Secondary | ICD-10-CM | POA: Insufficient documentation

## 2015-06-13 DIAGNOSIS — N289 Disorder of kidney and ureter, unspecified: Secondary | ICD-10-CM | POA: Insufficient documentation

## 2015-06-13 DIAGNOSIS — J449 Chronic obstructive pulmonary disease, unspecified: Secondary | ICD-10-CM | POA: Insufficient documentation

## 2015-06-13 DIAGNOSIS — Z7982 Long term (current) use of aspirin: Secondary | ICD-10-CM | POA: Insufficient documentation

## 2015-06-13 DIAGNOSIS — E785 Hyperlipidemia, unspecified: Secondary | ICD-10-CM | POA: Diagnosis not present

## 2015-06-13 DIAGNOSIS — I10 Essential (primary) hypertension: Secondary | ICD-10-CM | POA: Diagnosis not present

## 2015-06-13 DIAGNOSIS — Z79899 Other long term (current) drug therapy: Secondary | ICD-10-CM | POA: Diagnosis not present

## 2015-06-13 DIAGNOSIS — I252 Old myocardial infarction: Secondary | ICD-10-CM | POA: Diagnosis not present

## 2015-06-13 DIAGNOSIS — R918 Other nonspecific abnormal finding of lung field: Secondary | ICD-10-CM

## 2015-06-13 LAB — CBC
HEMATOCRIT: 35.8 % — AB (ref 36.0–46.0)
HEMOGLOBIN: 11.3 g/dL — AB (ref 12.0–15.0)
MCH: 32.1 pg (ref 26.0–34.0)
MCHC: 31.6 g/dL (ref 30.0–36.0)
MCV: 101.7 fL — ABNORMAL HIGH (ref 78.0–100.0)
Platelets: 163 10*3/uL (ref 150–400)
RBC: 3.52 MIL/uL — AB (ref 3.87–5.11)
RDW: 12.8 % (ref 11.5–15.5)
WBC: 6.4 10*3/uL (ref 4.0–10.5)

## 2015-06-13 LAB — APTT: aPTT: 26 seconds (ref 24–37)

## 2015-06-13 LAB — PROTIME-INR
INR: 1 (ref 0.00–1.49)
PROTHROMBIN TIME: 13.4 s (ref 11.6–15.2)

## 2015-06-13 MED ORDER — HYDROCODONE-ACETAMINOPHEN 5-325 MG PO TABS
ORAL_TABLET | ORAL | Status: AC
Start: 2015-06-13 — End: 2015-06-13
  Filled 2015-06-13: qty 1

## 2015-06-13 MED ORDER — LIDOCAINE HCL 1 % IJ SOLN
INTRAMUSCULAR | Status: AC
  Filled 2015-06-13: qty 20

## 2015-06-13 MED ORDER — SODIUM CHLORIDE 0.9 % IV SOLN: INTRAVENOUS | Status: DC

## 2015-06-13 MED ORDER — SODIUM CHLORIDE 0.9 % IV SOLN
INTRAVENOUS | Status: AC | PRN
  Administered 2015-06-13: 10 mL/h via INTRAVENOUS

## 2015-06-13 MED ORDER — MIDAZOLAM HCL 2 MG/2ML IJ SOLN
INTRAMUSCULAR | Status: AC | PRN
  Administered 2015-06-13: 1 mg via INTRAVENOUS

## 2015-06-13 MED ORDER — MIDAZOLAM HCL 2 MG/2ML IJ SOLN
INTRAMUSCULAR | Status: AC
  Filled 2015-06-13: qty 4

## 2015-06-13 MED ORDER — FENTANYL CITRATE (PF) 100 MCG/2ML IJ SOLN
INTRAMUSCULAR | Status: AC
  Filled 2015-06-13: qty 4

## 2015-06-13 MED ORDER — FENTANYL CITRATE (PF) 100 MCG/2ML IJ SOLN
INTRAMUSCULAR | Status: AC | PRN
  Administered 2015-06-13 (×3): 25 ug via INTRAVENOUS

## 2015-06-13 MED ORDER — HYDROCODONE-ACETAMINOPHEN 5-325 MG PO TABS
1.0000 | ORAL_TABLET | Freq: Once | ORAL | Status: AC
  Administered 2015-06-13: 1 via ORAL

## 2015-06-13 NOTE — Sedation Documentation (Signed)
Pt reports some discomfort with procedure, additional medication given as discussed with Dr. Miles CostainShick.

## 2015-06-13 NOTE — H&P (Signed)
Chief Complaint: Patient was seen in consultation today for right lung nodule biopsy at the request of Hawkins,Edward  Referring Physician(s): Hawkins,Edward  Supervising Physician: Ruel Favors  History of Present Illness: Sara Riggs is a 75 y.o. female   Stopped smoking just last week Hx COPD Found to have Rt lung nodule over 1 year ago Referred to Dr Shaune Pollack And followed Recent PET 06/01/2015  IMPRESSION: Enlarging 11 mm poorly defined pulmonary nodule in peripheral right upper lobe is hypermetabolic with SUV max of 4.1. This is suspicious for bronchogenic carcinoma, although infectious or inflammatory etiology not excluded.  Slowly enlarging 2 cm well-circumscribed right upper lobe pulmonary nodule shows absence of metabolic activity, suggesting a benign etiology such as a hamartoma.  Stable 8 mm ground-glass nodule in left upper lobe. Continued annual followup by CT recommended.  No evidence of hypermetabolic thoracic lymphadenopathy or extra thoracic metastatic disease.  Now scheduled for biopsy of same   Past Medical History  Diagnosis Date  . MI (myocardial infarction) (HCC)     NON-ST elevation MI probable type 2 11/10 normal coronary arteries 1999  . Hypertension   . COPD (chronic obstructive pulmonary disease) (HCC)   . Morbid obesity (HCC)   . Fibromyalgia   . Hyperlipidemia   . Renal insufficiency     Past Surgical History  Procedure Laterality Date  . Cesarean section    . Cholecystectomy    . Total knee arthroplasty      left  . Carpal tunnel release      Allergies: Review of patient's allergies indicates no known allergies.  Medications: Prior to Admission medications   Medication Sig Start Date End Date Taking? Authorizing Provider  albuterol (PROVENTIL HFA;VENTOLIN HFA) 108 (90 BASE) MCG/ACT inhaler Inhale 1-2 puffs into the lungs every 6 (six) hours as needed for wheezing or shortness of breath.   Yes Historical  Provider, MD  aspirin EC 81 MG tablet Take 81 mg by mouth daily.   Yes Historical Provider, MD  citalopram (CELEXA) 40 MG tablet Take 40 mg by mouth daily.   Yes Historical Provider, MD  diazepam (VALIUM) 10 MG tablet Take 10 mg by mouth 2 (two) times daily as needed for anxiety.    Yes Historical Provider, MD  Fluticasone-Salmeterol (ADVAIR) 250-50 MCG/DOSE AEPB Inhale 1 puff into the lungs 2 (two) times daily.   Yes Historical Provider, MD  furosemide (LASIX) 40 MG tablet Take 40 mg by mouth daily.    Yes Historical Provider, MD  HYDROcodone-acetaminophen (NORCO) 7.5-325 MG tablet Take 1 tablet by mouth every 4 (four) hours as needed for moderate pain.   Yes Historical Provider, MD  ipratropium (ATROVENT) 0.03 % nasal spray Place 2 sprays into both nostrils 2 (two) times daily as needed for rhinitis.   Yes Historical Provider, MD  lisinopril (PRINIVIL,ZESTRIL) 20 MG tablet Take 20 mg by mouth daily.   Yes Historical Provider, MD  meloxicam (MOBIC) 7.5 MG tablet Take 7.5 mg by mouth at bedtime.    Yes Historical Provider, MD  omeprazole (PRILOSEC) 40 MG capsule Take 40 mg by mouth daily.   Yes Historical Provider, MD  potassium chloride (K-DUR) 10 MEQ tablet Take 10 mEq by mouth daily.   Yes Historical Provider, MD  simvastatin (ZOCOR) 10 MG tablet Take 10 mg by mouth at bedtime.    Yes Historical Provider, MD  tiZANidine (ZANAFLEX) 4 MG tablet Take 4 mg by mouth every 8 (eight) hours as needed for muscle spasms.  Yes Historical Provider, MD  traZODone (DESYREL) 50 MG tablet Take 50 mg by mouth at bedtime.   Yes Historical Provider, MD  diphenhydrAMINE (BENADRYL) 25 mg capsule Take 25 mg by mouth daily as needed for allergies.    Historical Provider, MD     Family History  Problem Relation Age of Onset  . Heart attack Mother   . Heart disease Sister     cabg    Social History   Social History  . Marital Status: Married    Spouse Name: N/A  . Number of Children: N/A  . Years of  Education: N/A   Social History Main Topics  . Smoking status: Former Smoker    Quit date: 06/06/2015  . Smokeless tobacco: None  . Alcohol Use: No  . Drug Use: None  . Sexual Activity: Not Asked   Other Topics Concern  . None   Social History Narrative     Review of Systems: A 12 point ROS discussed and pertinent positives are indicated in the HPI above.  All other systems are negative.  Review of Systems  Constitutional: Negative for activity change and fatigue.  Respiratory: Positive for shortness of breath.   Musculoskeletal: Positive for back pain.  Neurological: Positive for weakness.  Psychiatric/Behavioral: Negative for behavioral problems and confusion.    Vital Signs: BP 160/90 mmHg  Pulse 65  Temp(Src) 98.6 F (37 C)  SpO2 100%  Physical Exam  Constitutional: She is oriented to person, place, and time.  Cardiovascular: Normal rate and regular rhythm.   No murmur heard. Pulmonary/Chest: Effort normal and breath sounds normal. She has no wheezes.  Abdominal: Soft. Bowel sounds are normal. There is no tenderness.  Musculoskeletal: Normal range of motion.  Neurological: She is alert and oriented to person, place, and time.  Skin: Skin is warm and dry.  Psychiatric: She has a normal mood and affect. Her behavior is normal. Judgment and thought content normal.  Nursing note and vitals reviewed.   Mallampati Score:  MD Evaluation Airway: WNL Heart: WNL Abdomen: WNL Chest/ Lungs: WNL ASA  Classification: 3 Mallampati/Airway Score: One  Imaging: Nm Pet Image Initial (pi) Skull Base To Thigh  06/01/2015  CLINICAL DATA:  Initial treatment strategy for right lung nodules. EXAM: NUCLEAR MEDICINE PET SKULL BASE TO THIGH TECHNIQUE: 10.1 mCi F-18 FDG was injected intravenously. Full-ring PET imaging was performed from the skull base to thigh after the radiotracer. CT data was obtained and used for attenuation correction and anatomic localization. FASTING BLOOD  GLUCOSE:  Value: 117 mg/dl COMPARISON:  Chest CT on 03/24/2015, 09/10/2012, and PET-CT on 07/19/2011 FINDINGS: NECK No hypermetabolic lymph nodes in the neck. CHEST No hypermetabolic mediastinal or hilar nodes.  Mild emphysema noted. 2 cm well-circumscribed pulmonary nodule in the medial right upper lobe which shows gradual enlargement since previous studies shows no associated metabolic activity. 11 mm poorly defined pulmonary nodule in the peripheral right upper lobe on image 45 of series 8 shows further enlargement compared with recent study on 03/24/2015, and is hypermetabolic with SUV max of 4.1. Stable ground-glass nodule noted in the posterior left upper lobe measuring 8 mm on image 29/series 8 which shows no metabolic activity. ABDOMEN/PELVIS No abnormal hypermetabolic activity within the liver, pancreas, adrenal glands, or spleen. No hypermetabolic lymph nodes in the abdomen or pelvis. Right renal cysts remain stable. Colonic diverticulosis and prior hysterectomy again noted. SKELETON No focal hypermetabolic activity to suggest skeletal metastasis. IMPRESSION: Enlarging 11 mm poorly defined pulmonary nodule in  peripheral right upper lobe is hypermetabolic with SUV max of 4.1. This is suspicious for bronchogenic carcinoma, although infectious or inflammatory etiology not excluded. Slowly enlarging 2 cm well-circumscribed right upper lobe pulmonary nodule shows absence of metabolic activity, suggesting a benign etiology such as a hamartoma. Stable 8 mm ground-glass nodule in left upper lobe. Continued annual followup by CT recommended. No evidence of hypermetabolic thoracic lymphadenopathy or extra thoracic metastatic disease. Electronically Signed   By: Myles RosenthalJohn  Stahl M.D.   On: 06/01/2015 11:41    Labs:  CBC: No results for input(s): WBC, HGB, HCT, PLT in the last 8760 hours.  COAGS: No results for input(s): INR, APTT in the last 8760 hours.  BMP: No results for input(s): NA, K, CL, CO2, GLUCOSE,  BUN, CALCIUM, CREATININE, GFRNONAA, GFRAA in the last 8760 hours.  Invalid input(s): CMP  LIVER FUNCTION TESTS: No results for input(s): BILITOT, AST, ALT, ALKPHOS, PROT, ALBUMIN in the last 8760 hours.  TUMOR MARKERS: No results for input(s): AFPTM, CEA, CA199, CHROMGRNA in the last 8760 hours.  Assessment and Plan:  Enlarging RUL nodule +PET Now scheduled for peripheral RUL nodule bx Risks and Benefits discussed with the patient including, but not limited to bleeding, hemoptysis, respiratory failure requiring intubation, infection, pneumothorax requiring chest tube placement, stroke from air embolism or even death. All of the patient's questions were answered, patient is agreeable to proceed. Consent signed and in chart.   Thank you for this interesting consult.  I greatly enjoyed meeting Darreld Mcleandith G Rundquist and look forward to participating in their care.  A copy of this report was sent to the requesting provider on this date.  Electronically Signed: Naamah Boggess A 06/13/2015, 10:32 AM   I spent a total of  30 Minutes   in face to face in clinical consultation, greater than 50% of which was counseling/coordinating care for rt lung mass bx

## 2015-06-13 NOTE — Sedation Documentation (Signed)
Patient denies pain and is resting comfortably.  

## 2015-06-13 NOTE — Sedation Documentation (Signed)
Patient is resting comfortably. 

## 2015-06-13 NOTE — Discharge Instructions (Signed)

## 2015-06-13 NOTE — Procedures (Signed)
Ct Bx RUL peripheral 10mm  Nodule Trace ptx HD stable Asymptomatic Path pending Full report in PACS

## 2015-08-15 ENCOUNTER — Encounter: Payer: Self-pay | Admitting: *Deleted

## 2015-08-15 ENCOUNTER — Ambulatory Visit (INDEPENDENT_AMBULATORY_CARE_PROVIDER_SITE_OTHER): Payer: Medicare Other | Admitting: Cardiology

## 2015-08-15 ENCOUNTER — Encounter: Payer: Self-pay | Admitting: Cardiology

## 2015-08-15 VITALS — BP 118/75 | HR 73 | Ht 65.0 in | Wt 198.0 lb

## 2015-08-15 DIAGNOSIS — R079 Chest pain, unspecified: Secondary | ICD-10-CM

## 2015-08-15 DIAGNOSIS — R911 Solitary pulmonary nodule: Secondary | ICD-10-CM | POA: Diagnosis not present

## 2015-08-15 DIAGNOSIS — E785 Hyperlipidemia, unspecified: Secondary | ICD-10-CM

## 2015-08-15 DIAGNOSIS — R0789 Other chest pain: Secondary | ICD-10-CM

## 2015-08-15 DIAGNOSIS — J449 Chronic obstructive pulmonary disease, unspecified: Secondary | ICD-10-CM

## 2015-08-15 NOTE — Patient Instructions (Signed)
Medication Instructions:  Your physician recommends that you continue on your current medications as directed. Please refer to the Current Medication list given to you today.  Labwork: NONE  Testing/Procedures: Your physician has requested that you have an echocardiogram. Echocardiography is a painless test that uses sound waves to create images of your heart. It provides your doctor with information about the size and shape of your heart and how well your heart's chambers and valves are working. This procedure takes approximately one hour. There are no restrictions for this procedure.  Follow-Up:  [facility] and your echo result.  We will call you with the results of your echocardiogram.  Any Other Special Instructions Will Be Listed Below (If Applicable).  If you need a refill on your cardiac medications before your next appointment, please call your pharmacy.

## 2015-08-15 NOTE — Progress Notes (Signed)
Cardiology Office Note  Date: 08/15/2015   ID: Marcela, Alatorre [birth year], MRN [id number]  PCP: Kirstie Peri, MD  Consulting Cardiologist: Nona Dell, MD   Chief Complaint  Patient presents with  . Cardiac evaluation    History of Present Illness: Sara Riggs is a 75 y.o. female referred for cardiology consultation by Dr. Sherryll Burger. She was followed by our practice in the past, last seen in 2011. History includes normal coronary arteries at cardiac catheterization in 1999. She is here today with her daughter. At this point I do not have complete information. She was admitted to [facility] over the weekend after an episode of chest discomfort and difficulty speaking. She states that she felt like bees were stinging her from her head to her waist, and she could not say what she needed to say. She was transported via EMS. Daughter states that the patient's husband indicated the symptoms started after she got back from going out to the trashcan outside.  Daughter also mentions that there are concerns about her current medications as it relates to mental status changes and sedatives. Reports that her mother has been "talking out of her head" sometimes. Sara Riggs does not report having any subsequent chest pain since her evaluation at [facility]. He states that she was told that the symptoms were related to "stress."  We are requesting her records from [place], I did review Dr. Margaretmary Eddy note. Her last echocardiogram was in 2010 as outlined below.  Past Medical History  Diagnosis Date  . NSTEMI (non-ST elevated myocardial infarction) (HCC) 2010  . Essential hypertension   . COPD (chronic obstructive pulmonary disease) (HCC)   . Fibromyalgia   . Hyperlipidemia   . Renal insufficiency   . History of cardiac catheterization     Normal coronaries 1999    Past Surgical History  Procedure Laterality Date  . Cesarean section    . Cholecystectomy    . Total knee arthroplasty Left   .  Carpal tunnel release      Current Outpatient Prescriptions  Medication Sig Dispense Refill  . albuterol (PROVENTIL HFA;VENTOLIN HFA) 108 (90 BASE) MCG/ACT inhaler Inhale 1-2 puffs into the lungs every 6 (six) hours as needed for wheezing or shortness of breath.    Marland Kitchen aspirin EC 81 MG tablet Take 81 mg by mouth daily.    . diazepam (VALIUM) 10 MG tablet Take 10 mg by mouth 2 (two) times daily as needed for anxiety.     . Fluticasone-Salmeterol (ADVAIR) 250-50 MCG/DOSE AEPB Inhale 1 puff into the lungs 2 (two) times daily.    . furosemide (LASIX) 20 MG tablet Take 20 mg by mouth daily.    Marland Kitchen HYDROcodone-acetaminophen (LORTAB 10) 10-500 MG tablet Take 1 tablet by mouth every 6 (six) hours as needed for pain.    Marland Kitchen ipratropium-albuterol (DUONEB) 0.5-2.5 (3) MG/3ML SOLN Take 3 mLs by nebulization every 6 (six) hours as needed.    Marland Kitchen lisinopril (PRINIVIL,ZESTRIL) 10 MG tablet Take 10 mg by mouth daily.    Marland Kitchen omeprazole (PRILOSEC) 20 MG capsule Take 20 mg by mouth daily.    . OXYGEN Inhale into the lungs. 3 L/min 24/7    . potassium chloride (K-DUR) 10 MEQ tablet Take 10 mEq by mouth daily.    . simvastatin (ZOCOR) 10 MG tablet Take 10 mg by mouth at bedtime.     Marland Kitchen tiZANidine (ZANAFLEX) 4 MG tablet Take 4 mg by mouth every 8 (eight) hours as needed for muscle spasms.    Marland Kitchen  traZODone (DESYREL) 50 MG tablet Take 50 mg by mouth at bedtime.     No current facility-administered medications for this visit.   Allergies:  Review of patient's allergies indicates no known allergies.   Social History: The patient  reports that she quit smoking about 2 months ago. Her smoking use included Cigarettes. She does not have any smokeless tobacco history on file. She reports that she does not drink alcohol.   Family History: The patient's family history includes Heart attack in her mother; Heart disease in her sister.   ROS:  Please see the history of present illness. Otherwise, complete review of systems is positive  for chronic knee pain.  All other systems are reviewed and negative.   Physical Exam: VS:  BP 118/75 mmHg  Pulse 73  Ht 5\' 5"  (1.651 m)  Wt 198 lb (89.812 kg)  BMI 32.95 kg/m2  SpO2 92%, BMI Body mass index is 32.95 kg/(m^2).  Wt Readings from Last 3 Encounters:  08/15/15 198 lb (89.812 kg)  06/16/14 204 lb 9.6 oz (92.806 kg)  03/10/09 269 lb 12 oz (122.358 kg)    General: Overweight woman, appears comfortable at rest. HEENT: Conjunctiva and lids normal, oropharynx clear. Neck: Supple, no elevated JVP or carotid bruits, no thyromegaly. Lungs: Diminished breath sounds without wheezing, nonlabored breathing at rest. Cardiac: Regular rate and rhythm, no S3, soft systolic murmur, no pericardial rub. Abdomen: Soft, nontender, bowel sounds present, no guarding or rebound. Extremities: Trace ankle edema, distal pulses 2+. Skin: Warm and dry. Musculoskeletal: No kyphosis. Neuropsychiatric: Alert and oriented x3, affect grossly appropriate.  ECG: I personally reviewed the tracing from 12/22/2008 which showed sinus rhythm with left anterior fascicular block, decreased R wave progression, low voltage.  Recent Labwork: 06/13/2015: Hemoglobin 11.3*; Platelets 163   Other Studies Reviewed Today:  Chest CT biopsy 06/13/2015: PROCEDURE: The procedure, risks, benefits, and alternatives were explained to the patient. Questions regarding the procedure were encouraged and answered. The patient understands and consents to the procedure.  Previous imaging reviewed. Patient position supine. Noncontrast localization CT performed. The 10 mm peripheral right upper lobe nodule was localized.  The right anterior chest was prepped with ChloraPrep in a sterile fashion, and a sterile drape was applied covering the operative field. A sterile gown and sterile gloves were used for the procedure.  Under CT guidance, a(n) 17 gauge gauge guide needle was advanced into the right upper lobe 10 mm nodule.  4 18 gauge core biopsies attempted. Fragmented samples placed in formalin. Biosentry device utilized for pneumothorax prevention. The guide needle was removed. Final imaging was performed.  Trace surrounding hemorrhage about the lesion from the biopsy. Small anterior pneumothorax noted. Patient remains asymptomatic and hemodynamically stable.  Patient tolerated the procedure well without complication. Vital sign monitoring by nursing staff during the procedure will continue as patient is in the special procedures unit for post procedure observation.  FINDINGS: The images document guide needle placement within the peripheral right upper lobe nodule. Post biopsy images demonstrate trace surrounding right upper lobe pulmonary hemorrhage and anterior pneumothorax.  COMPLICATIONS: SIR Level A - No therapy, no consequence.  IMPRESSION: Successful CT-guided core biopsy of the peripheral 10 mm right upper lobe nodule  Echocardiogram 12/23/2008 [facility] Mild to moderate LVH with LVEF 60-65%, grade 1 diastolic dysfunction, mildly dilated RV, sclerotic aortic valve without stenosis, MAC with mild mitral regurgitation, mild tricuspid regurgitation with RVSP 21 mmHg, trivial pericardial effusion.  Assessment and Plan:  1. Recent episode of what sounds  like fairly atypical chest discomfort. I am requesting records from her recent stay at [facility] to determine what testing has already been completed. We are obtaining an echocardiogram to follow-up cardiac structure and function as well as. Further plans from there.  2. History of normal coronary arteries at cardiac catheterization 1999.  3. Essential hypertension, blood pressure control is good today.  4. Hyperlipidemia, on Zocor.  5. COPD and history of lung nodule. Patient reports pending follow-up visit with Dr. Juanetta Gosling whom she has seen in the past.  Current medicines were reviewed with the patient today.   Orders Placed  This Encounter  Procedures  . ECHOCARDIOGRAM COMPLETE    Disposition: Reviewed records and follow up on echocardiogram. Call with results and if additional testing is required.  Signed, Jonelle Sidle, MD, [facility] 08/15/2015 1:50 PM    [facility] at [facility] [address] [place], [place], [place] [address] Phone: [phone]; Fax: [phone]

## 2015-08-24 ENCOUNTER — Other Ambulatory Visit: Payer: Self-pay

## 2015-08-24 ENCOUNTER — Ambulatory Visit (INDEPENDENT_AMBULATORY_CARE_PROVIDER_SITE_OTHER): Payer: Medicare Other

## 2015-08-24 DIAGNOSIS — R079 Chest pain, unspecified: Secondary | ICD-10-CM

## 2015-08-24 LAB — ECHOCARDIOGRAM COMPLETE
CHL CUP DOP CALC LVOT VTI: 24.7 cm
CHL CUP MV DEC (S): 227
CHL CUP STROKE VOLUME: 49 mL
E decel time: 227 msec
EERAT: 14.34
FS: 29 % (ref 28–44)
IVS/LV PW RATIO, ED: 1.06
LA ID, A-P, ES: 35 mm
LA diam index: 1.7 cm/m2
LA vol A4C: 43.3 ml
LA vol: 52.9 mL
LAVOLIN: 25.6 mL/m2
LDCA: 3.8 cm2
LEFT ATRIUM END SYS DIAM: 35 mm
LV E/e'average: 14.34
LV PW d: 11.6 mm — AB (ref 0.6–1.1)
LV SIMPSON'S DISK: 53
LV dias vol: 92 mL (ref 46–106)
LV sys vol index: 21 mL/m2
LV sys vol: 43 mL — AB (ref 14–42)
LVDIAVOLIN: 45 mL/m2
LVEEMED: 14.34
LVELAT: 3.96 cm/s
LVOTD: 22 mm
LVOTPV: 110 cm/s
LVOTSV: 94 mL
MV pk A vel: 126 m/s
MVPKEVEL: 56.8 m/s
RV TAPSE: 22.8 mm
TDI e' lateral: 3.96
TDI e' medial: 3.67

## 2015-08-31 ENCOUNTER — Telehealth: Payer: Self-pay | Admitting: *Deleted

## 2015-08-31 DIAGNOSIS — I7789 Other specified disorders of arteries and arterioles: Secondary | ICD-10-CM

## 2015-08-31 DIAGNOSIS — Z87891 Personal history of nicotine dependence: Secondary | ICD-10-CM

## 2015-08-31 DIAGNOSIS — I10 Essential (primary) hypertension: Secondary | ICD-10-CM

## 2015-08-31 NOTE — Telephone Encounter (Signed)
Notes Recorded by Lesle ChrisAngela G Jarmon Javid, LPN on 4/0/98117/07/2015 at 1:24 PM Patient notified. She agrees to do US of abdomen. Will put order in & fwd to scheduling. Copy to pmd.  Notes Recorded by Jonelle SidleSamuel G McDowell, MD on 08/24/2015 at 9:47 PM Results reviewed. Normal LVEF in 55-60% range, no significant change from before. Calcification of aortic and mitral annulus, but no significant degree of regurgitation to expect symptoms. Abdominal aorta size enlarged per limited imaging. Suggest abdominal US to better evaluate. I did review her records from [place] as well. Troponin T levels were normal and symptoms atypical. No stress testing for now unless she has recurring chest pain without other explanation. [facility] of this test [facility].

## 2015-09-12 ENCOUNTER — Encounter: Payer: Self-pay | Admitting: Cardiovascular Disease

## 2015-09-14 ENCOUNTER — Ambulatory Visit: Payer: Medicare Other

## 2015-09-14 DIAGNOSIS — I7789 Other specified disorders of arteries and arterioles: Secondary | ICD-10-CM

## 2015-09-14 DIAGNOSIS — I10 Essential (primary) hypertension: Secondary | ICD-10-CM

## 2015-09-14 DIAGNOSIS — Z87891 Personal history of nicotine dependence: Secondary | ICD-10-CM

## 2015-09-18 ENCOUNTER — Telehealth: Payer: Self-pay | Admitting: *Deleted

## 2015-09-18 NOTE — Telephone Encounter (Signed)
Patient informed and copy sent to PCP. 

## 2015-09-18 NOTE — Telephone Encounter (Signed)
-----   Message from Jonelle Sidle, MD sent at 09/15/2015  9:04 AM EDT ----- Results reviewed. There was only mild dilatation of the abdominal aorta at the level of the IMA, measuring 2.3 cm x 2.7 cm, not technically aneurysmal. No need for further workup at this time. A copy of this test should be forwarded to [facility], MD.

## 2015-09-19 ENCOUNTER — Telehealth: Payer: Self-pay | Admitting: Cardiology

## 2015-09-19 NOTE — Telephone Encounter (Signed)
Patient wants to talk more about test results

## 2015-09-21 NOTE — Telephone Encounter (Signed)
Patient was given results again on her abdominal u/s.

## 2015-10-05 ENCOUNTER — Telehealth (HOSPITAL_COMMUNITY): Payer: Self-pay | Admitting: *Deleted

## 2015-10-05 NOTE — Telephone Encounter (Signed)
Pt called office stating she just received a recording that said she have an appt on 10-09-15 at [facility]. Informed pt this was not the hospital and informed her with office name. After getting pt demographics to look her up, per chart, I have an appt with Dr. Tenny Crawoss. Informed pt with information about her appt on Monday 10-09-15 and she state that she's not going to keep the appt. Informed pt that [facility] to come see our doctor. Per pt, she don't have a way down to this office and wont be able to keep appt. Pt then asked if Dr. Sherryll BurgerShah was the one that did the appt for her. Informed pt that unfortunately because I was not the person that scheduled this appt, I did not know that information for her. Office then asked pt if she was still keeping her appt or did she want office to cancel her appt. Pt then stated she wants office to cancel her appt. Office verified again with pt to cancel appt and pt agreed.

## 2015-10-09 ENCOUNTER — Ambulatory Visit (HOSPITAL_COMMUNITY): Payer: Self-pay | Admitting: Psychiatry

## 2016-02-26 DEATH — deceased

## 2016-03-06 ENCOUNTER — Encounter: Payer: Self-pay | Admitting: *Deleted

## 2016-03-06 NOTE — Progress Notes (Deleted)
Cardiology Office Note  Date: 03/06/2016   ID: Sara Riggs, [name] 09-23-1940, MRN [id number]  PCP: Kirstie Peri, MD  Primary Cardiologist: Nona Dell, MD   No chief complaint on file.   History of Present Illness: Sara Riggs is a 76 y.o. female last seen in June 2017.  Follow-up echocardiogram from June 2017 is outlined below showing normal LVEF.  Past Medical History:  Diagnosis Date  . COPD (chronic obstructive pulmonary disease) (HCC)   . Essential hypertension   . Fibromyalgia   . History of cardiac catheterization    Normal coronaries 1999  . Hyperlipidemia   . NSTEMI (non-ST elevated myocardial infarction) (HCC) 2010  . Renal insufficiency     Past Surgical History:  Procedure Laterality Date  . CARPAL TUNNEL RELEASE    . CESAREAN SECTION    . CHOLECYSTECTOMY    . TOTAL KNEE ARTHROPLASTY Left     Current Outpatient Prescriptions  Medication Sig Dispense Refill  . albuterol (PROVENTIL HFA;VENTOLIN HFA) 108 (90 BASE) MCG/ACT inhaler Inhale 1-2 puffs into the lungs every 6 (six) hours as needed for wheezing or shortness of breath.    Marland Kitchen aspirin EC 81 MG tablet Take 81 mg by mouth daily.    . diazepam (VALIUM) 10 MG tablet Take 10 mg by mouth 2 (two) times daily as needed for anxiety.     . Fluticasone-Salmeterol (ADVAIR) 250-50 MCG/DOSE AEPB Inhale 1 puff into the lungs 2 (two) times daily.    . furosemide (LASIX) 20 MG tablet Take 20 mg by mouth daily.    Marland Kitchen HYDROcodone-acetaminophen (LORTAB 10) 10-500 MG tablet Take 1 tablet by mouth every 6 (six) hours as needed for pain.    Marland Kitchen ipratropium-albuterol (DUONEB) 0.5-2.5 (3) MG/3ML SOLN Take 3 mLs by nebulization every 6 (six) hours as needed.    Marland Kitchen lisinopril (PRINIVIL,ZESTRIL) 10 MG tablet Take 10 mg by mouth daily.    Marland Kitchen omeprazole (PRILOSEC) 20 MG capsule Take 20 mg by mouth daily.    . OXYGEN Inhale into the lungs. 3 L/min 24/7    . potassium chloride (K-DUR) 10 MEQ tablet Take 10 mEq by mouth daily.      . simvastatin (ZOCOR) 10 MG tablet Take 10 mg by mouth at bedtime.     Marland Kitchen tiZANidine (ZANAFLEX) 4 MG tablet Take 4 mg by mouth every 8 (eight) hours as needed for muscle spasms.    . traZODone (DESYREL) 50 MG tablet Take 50 mg by mouth at bedtime.     No current facility-administered medications for this visit.    Allergies:  Patient has no known allergies.   Social History: The patient  reports that she quit smoking about 9 months ago. Her smoking use included Cigarettes. She does not have any smokeless tobacco history on file. She reports that she does not drink alcohol.   Family History: The patient's family history includes Heart attack in her mother; Heart disease in her sister.   ROS:  Please see the history of present illness. Otherwise, complete review of systems is positive for {NONE DEFAULTED:18576::"none"}.  All other systems are reviewed and negative.   Physical Exam: VS:  There were no vitals taken for this visit., BMI There is no height or weight on file to calculate BMI.  Wt Readings from Last 3 Encounters:  08/15/15 198 lb (89.8 kg)  06/16/14 204 lb 9.6 oz (92.8 kg)  03/10/09 (!) 269 lb 12 oz (122.4 kg)    General: Overweight woman, appears comfortable  at rest. HEENT: Conjunctiva and lids normal, oropharynx clear. Neck: Supple, no elevated JVP or carotid bruits, no thyromegaly. Lungs: Diminished breath sounds without wheezing, nonlabored breathing at rest. Cardiac: Regular rate and rhythm, no S3, soft systolic murmur, no pericardial rub. Abdomen: Soft, nontender, bowel sounds present, no guarding or rebound. Extremities: Trace ankle edema, distal pulses 2+. Skin: Warm and dry. Musculoskeletal: No kyphosis. Neuropsychiatric: Alert and oriented x3, affect grossly appropriate.  ECG: I personally reviewed the tracing from 08/11/2015 which showed a possible ectopic atrial rhythm with IVCD.  Recent Labwork: 06/13/2015: Hemoglobin 11.3; Platelets 163  June 2017: BUN 17,  creatinine 1.2, AST 15, ALT 11, potassium 4.0, troponin T less than 0.01, hemoglobin 10.5, platelets 198  Other Studies Reviewed Today:  Echocardiogram 08/24/2015: Study Conclusions  - Left ventricle: The cavity size was normal. Wall thickness was   increased in a pattern of mild LVH. Systolic function was normal.   The estimated ejection fraction was in the range of 55% to 60%.   Doppler parameters are consistent with abnormal left ventricular   relaxation (grade 1 diastolic dysfunction). Doppler parameters   are consistent with high ventricular filling pressure. - Aortic valve: Mildly calcified annulus. Trileaflet; mildly   thickened leaflets. Valve area (VTI): 3.19 cm^2. Valve area   (Vmax): 2.58 cm^2. Valve area (Vmean): 2.82 cm^2. - Aorta: Abdominal aorta mild to moderately dilated at 3.7 cm.   Consider dedicated abdominal aortic US. - Mitral valve: Moderately calcified annulus. Mildly thickened   leaflets . There is eccentric anterior directed MR. The   eccentricity may lead to underestimation of severity. MV VTI/ AV   VTI ratio of 1.1 suggests mild to moderate MR. Visually the MR   appears mild. There is a heavily calcified chordae attached ot   the anterior leafet. There was mild regurgitation. - Atrial septum: There was increased thickness of the septum,   consistent with lipomatous hypertrophy. No defect or patent   foramen ovale was identified. - Technically difficult study.  Abdominal ultrasound 09/14/2015: Mild dilatation of the abdominal aorta at the level of the IMA, measuring 2.3 cm x 2.7 cm, not technically aneurysmal. Normal caliber common and external iliac arteries, bilaterally. Aorto-iliac atherosclerosis, without stenosis.  Assessment and Plan:    Current medicines were reviewed with the patient today.  No orders of the defined types were placed in this encounter.   Disposition:  Signed, Jonelle SidleSamuel G. McDowell, MD, [facility] 03/06/2016 9:02 AM    [facility] [address], [place], [place] [address] Phone: [phone]) [phone]; Fax: [phone]

## 2016-03-07 ENCOUNTER — Encounter: Payer: Self-pay | Admitting: Cardiology

## 2016-04-05 ENCOUNTER — Encounter: Payer: Self-pay | Admitting: Cardiology

## 2017-04-14 IMAGING — DX DG CHEST 1V
1 series · 1 of 1 positions shown · non-contrast
Comparison: CT fluoroscopy from earlier the same day

CLINICAL DATA: Status post right lung biopsy.

EXAM:
CHEST 1 VIEW

[chest ap]
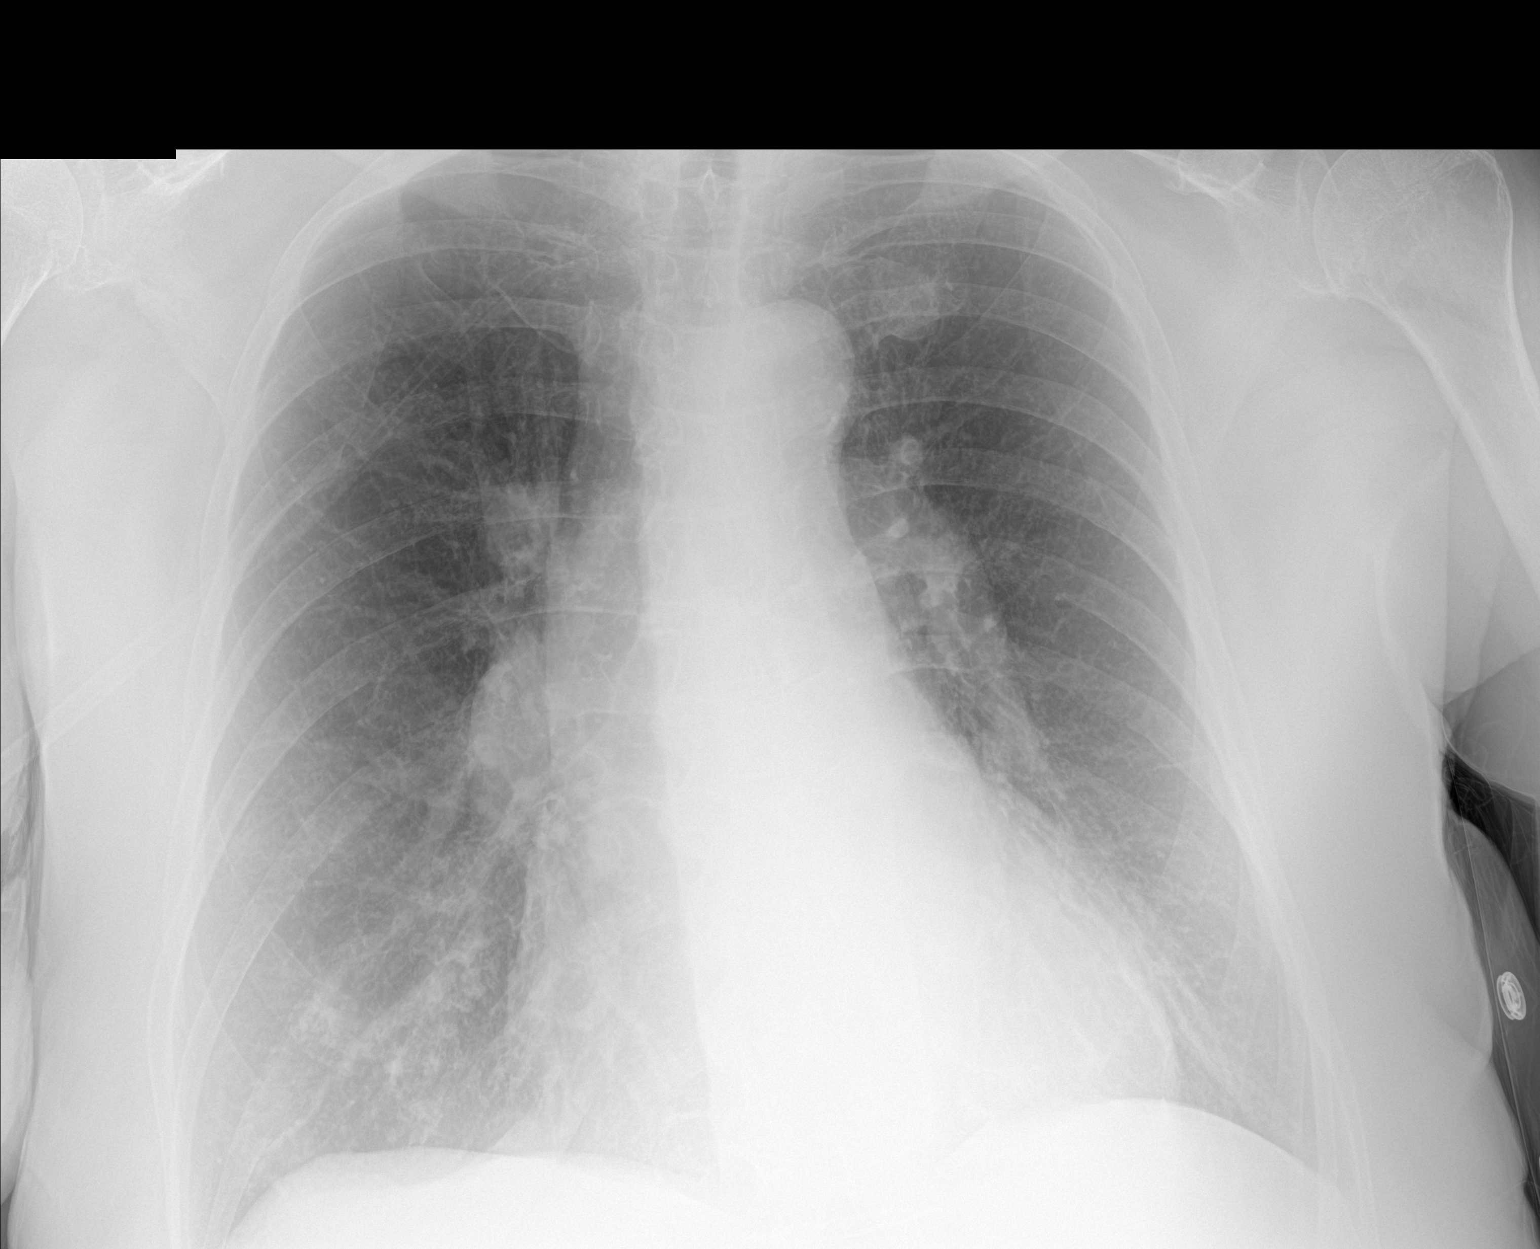

[1 of 1 positions shown; findings below may reference images not displayed]

FINDINGS: Small right apical pneumothorax. Right suprahilar lung nodule. No
visible alveolar or pleural hemorrhage. Dr. Sakhravi is already aware
of these findings. Normal heart size and mediastinal contours.
IMPRESSION: 5% or less right apical pneumothorax.

## 2017-04-14 IMAGING — CT CT BIOPSY
2 of 3 series · 14 of 32 positions shown, 19 images · non-contrast
Comparison: none

INDICATION: PET positive hypermetabolic 10 mm right upper lobe nodule

[Series 2: i-spiral 5.0 b40f · axial · 0.98mm/px · z∈[+1236,+1326]mm · 7 of 36 slices shown, 12 images]
[im 5/36  soft-tissue]
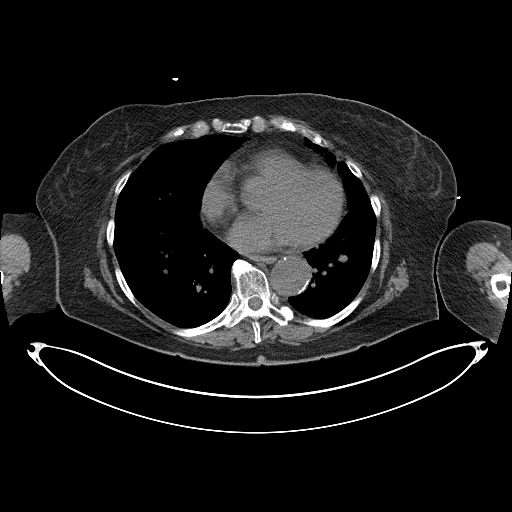
[im 5/36  bone]
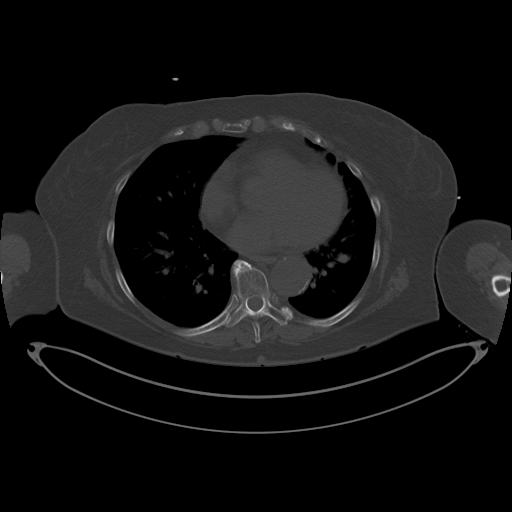
[im 9/36  soft-tissue]
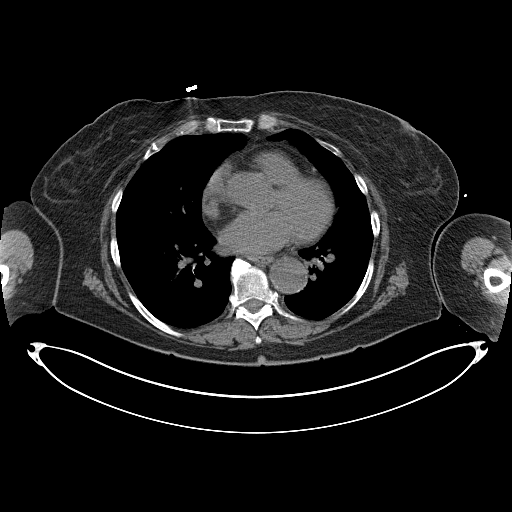
[im 14/36  soft-tissue]
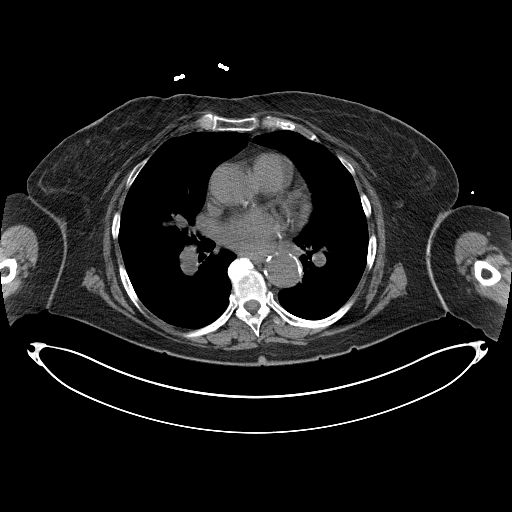
[im 18/36  soft-tissue]
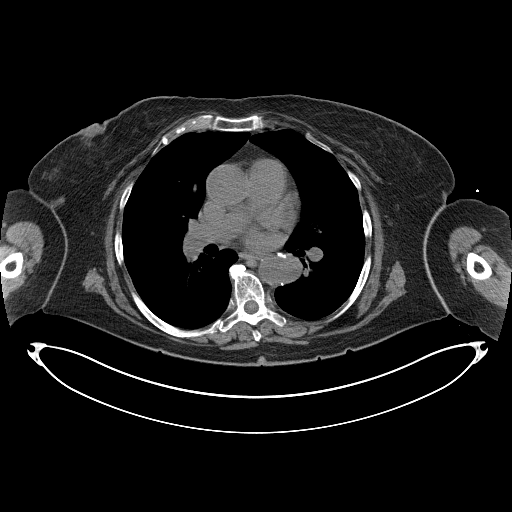
[im 18/36  lung]
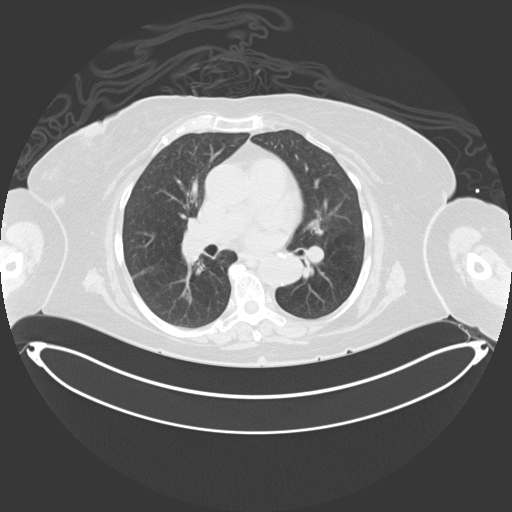
[im 22/36  soft-tissue]
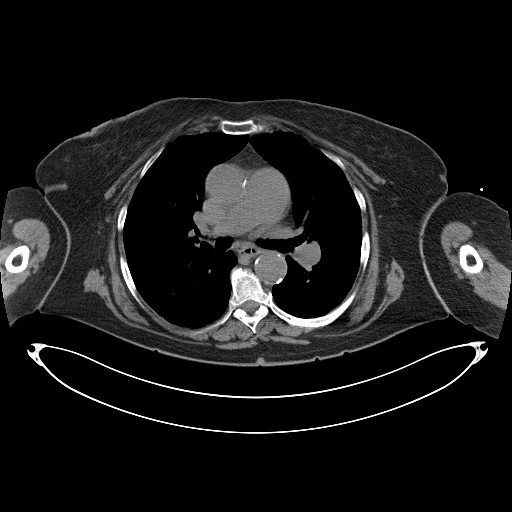
[im 22/36  lung]
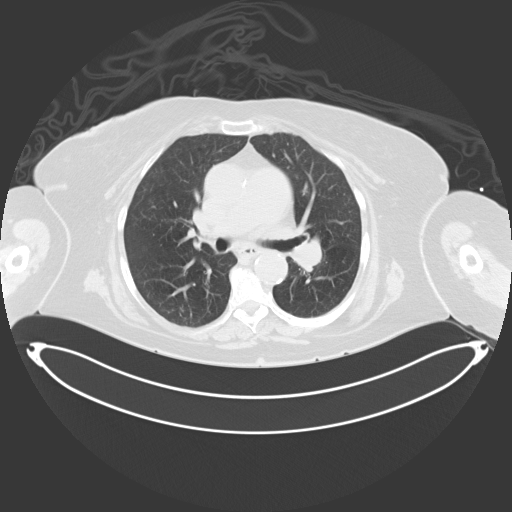
[im 27/36  soft-tissue]
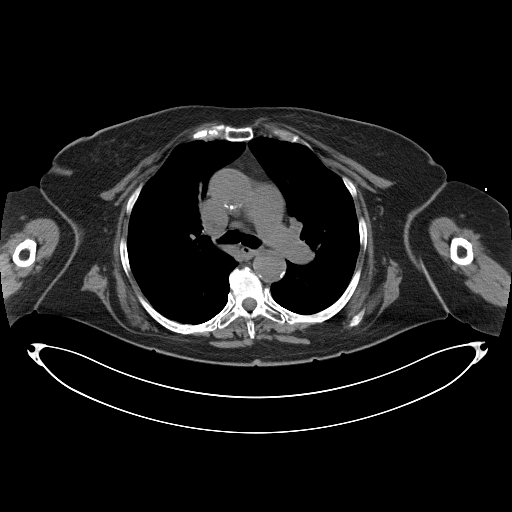
[im 27/36  lung]
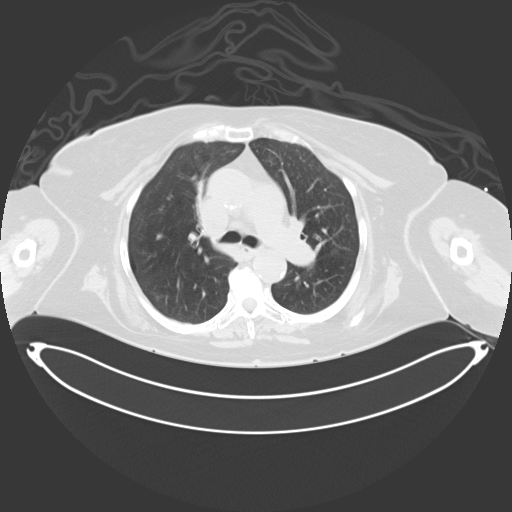
[im 31/36  soft-tissue]
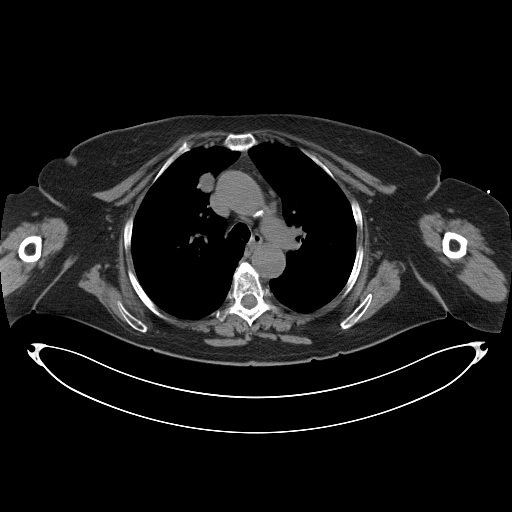
[im 31/36  lung]
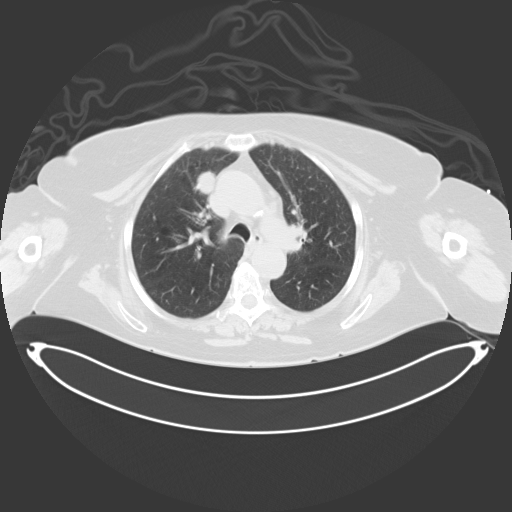

[Series 3: i-sequence 4.8 b40s · axial · 0.98mm/px · z∈[+1248,+1258]mm · 7 of 60 slices shown]
[im 5/60  soft-tissue]
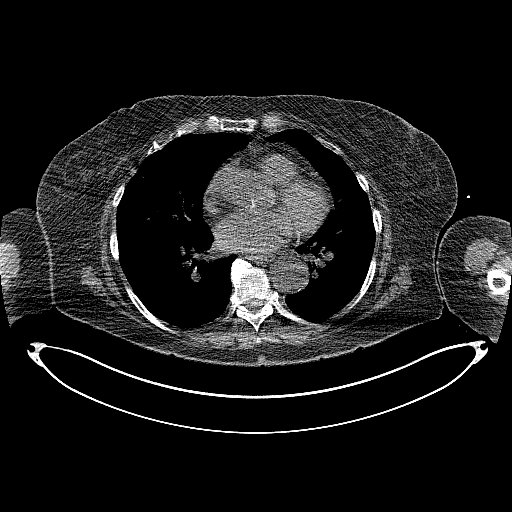
[im 13/60  soft-tissue]
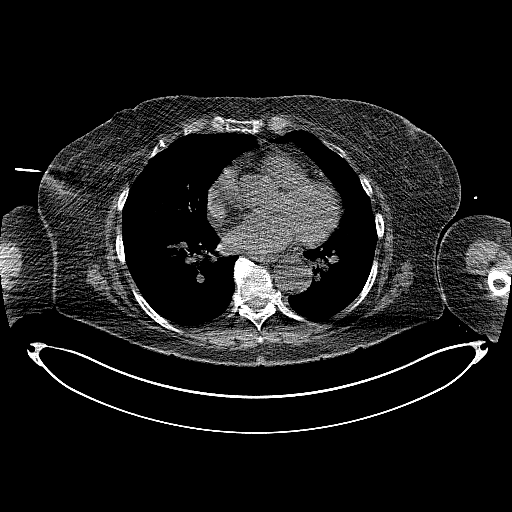
[im 22/60  soft-tissue]
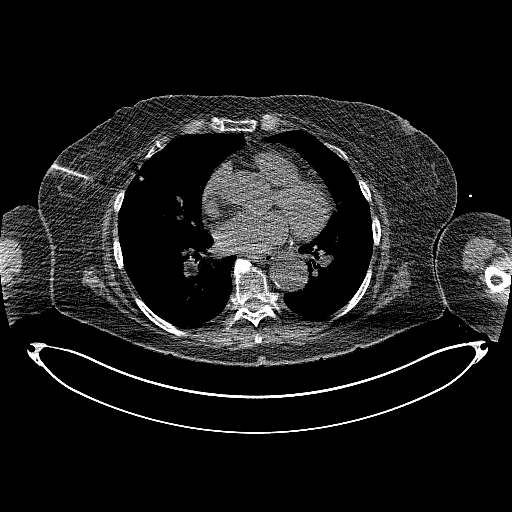
[im 26/60  soft-tissue]
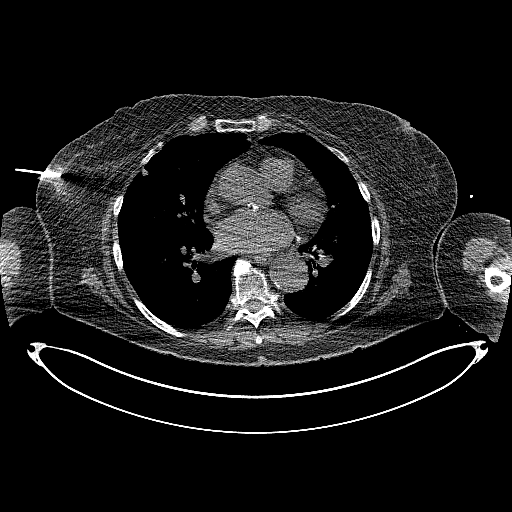
[im 34/60  soft-tissue]
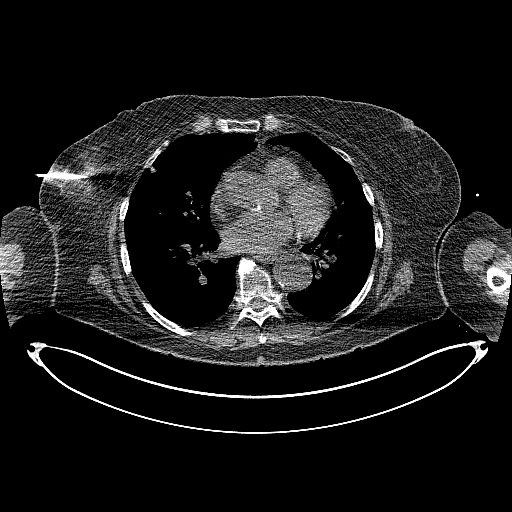
[im 38/60  soft-tissue]
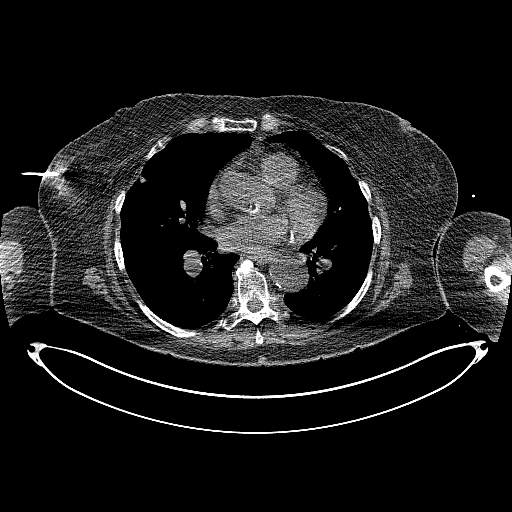
[im 47/60  soft-tissue]
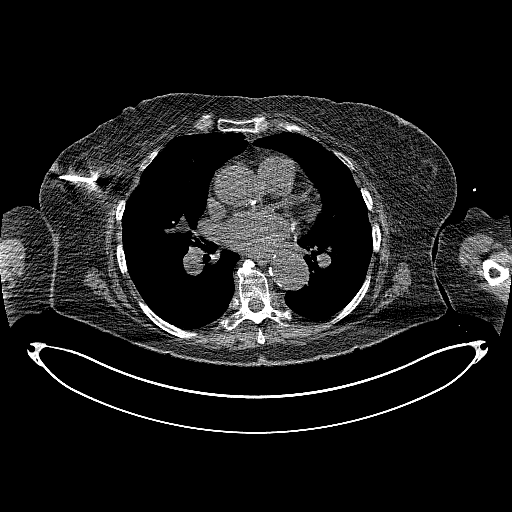

[14 of 32 positions shown; findings below may reference images not displayed]

EXAM:
CT-GUIDED BIOPSY 10 MM RIGHT UPPER LOBE PERIPHERAL NODULE

MEDICATIONS:
1% lidocaine locally

ANESTHESIA/SEDATION:
1.0 mg IV Versed; 75 mcg IV Fentanyl

Moderate Sedation Time:  37

The patient was continuously monitored during the procedure by the
interventional radiology nurse under my direct supervision.

PROCEDURE:
The procedure, risks, benefits, and alternatives were explained to
the patient. Questions regarding the procedure were encouraged and
answered. The patient understands and consents to the procedure.

Previous imaging reviewed. Patient position supine. Noncontrast
localization CT performed. The 10 mm peripheral right upper lobe
nodule was localized.

The right anterior chest was prepped with ChloraPrep in a sterile
fashion, and a sterile drape was applied covering the operative
field. A sterile gown and sterile gloves were used for the
procedure.

Under CT guidance, a(n) 17 gauge gauge guide needle was advanced
into the right upper lobe 10 mm nodule.. 4 18 gauge core biopsies
attempted. Fragmented samples placed in formalin. Biosentry device
utilized for pneumothorax prevention. The guide needle was removed.
Final imaging was performed.

Trace surrounding hemorrhage about the lesion from the biopsy. Small
anterior pneumothorax noted. Patient remains asymptomatic and
hemodynamically stable.

Patient tolerated the procedure well without complication. Vital
sign monitoring by nursing staff during the procedure will continue
as patient is in the special procedures unit for post procedure
observation.
FINDINGS: The images document guide needle placement within the peripheral
right upper lobe nodule. Post biopsy images demonstrate trace
surrounding right upper lobe pulmonary hemorrhage and anterior
pneumothorax..

COMPLICATIONS:
SIR Level A - No therapy, no consequence.
IMPRESSION: Successful CT-guided core biopsy of the peripheral 10 mm right upper
lobe nodule
